# Patient Record
Sex: Female | Born: 1970 | Race: White | Hispanic: No | Marital: Married | Smoking: Former smoker
Health system: Southern US, Community
[De-identification: ages and names within clinical notes are randomized; demographics above are authoritative.]

## PROBLEM LIST (undated history)

## (undated) ENCOUNTER — Ambulatory Visit: Admission: EM | Payer: PRIVATE HEALTH INSURANCE

## (undated) DIAGNOSIS — I499 Cardiac arrhythmia, unspecified: Secondary | ICD-10-CM

## (undated) DIAGNOSIS — E785 Hyperlipidemia, unspecified: Secondary | ICD-10-CM

## (undated) DIAGNOSIS — E119 Type 2 diabetes mellitus without complications: Secondary | ICD-10-CM

## (undated) DIAGNOSIS — A048 Other specified bacterial intestinal infections: Secondary | ICD-10-CM

## (undated) DIAGNOSIS — I10 Essential (primary) hypertension: Secondary | ICD-10-CM

## (undated) HISTORY — PX: LAPAROSCOPIC ENDOMETRIOSIS FULGURATION: SUR769

## (undated) HISTORY — DX: Cardiac arrhythmia, unspecified: I49.9

---

## 2016-06-06 ENCOUNTER — Encounter (HOSPITAL_COMMUNITY): Payer: Self-pay | Admitting: *Deleted

## 2016-06-06 ENCOUNTER — Ambulatory Visit (HOSPITAL_COMMUNITY)
Admission: EM | Admit: 2016-06-06 | Discharge: 2016-06-06 | Disposition: A | Discharge status: Home / Self Care | Payer: PRIVATE HEALTH INSURANCE | Attending: Family Medicine | Admitting: Family Medicine

## 2016-06-06 DIAGNOSIS — R6889 Other general symptoms and signs: Secondary | ICD-10-CM | POA: Diagnosis not present

## 2016-06-06 DIAGNOSIS — R112 Nausea with vomiting, unspecified: Secondary | ICD-10-CM | POA: Diagnosis not present

## 2016-06-06 DIAGNOSIS — R197 Diarrhea, unspecified: Secondary | ICD-10-CM

## 2016-06-06 MED ORDER — ONDANSETRON 4 MG PO TBDP: 4.0000 mg | ORAL_TABLET | Freq: Three times a day (TID) | ORAL | 0 refills | Status: DC | PRN

## 2016-06-06 MED ORDER — LOPERAMIDE HCL 2 MG PO CAPS: 2.0000 mg | ORAL_CAPSULE | Freq: Four times a day (QID) | ORAL | 0 refills | Status: DC | PRN

## 2016-06-06 NOTE — ED Provider Notes (Signed)
CSN: [id number]     Arrival date & time 06/06/16  1711 History   None    Chief Complaint  Patient presents with  . Nausea  . Emesis  . Diarrhea  . Abdominal Pain   (Consider location/radiation/quality/duration/timing/severity/associated sxs/prior Treatment) 46 year old female presents with 24 hour history of nausea, vomiting, and diarrhea along with muscle aches and body aches. She denies fever but has had chills, no cough, no shortness of breath but has had some congestion. She has been drinking fluids but has not eaten, her appetite has been decreased.   The history is provided by the patient.  Emesis  Associated symptoms: abdominal pain and diarrhea   Diarrhea  Associated symptoms: abdominal pain and vomiting   Abdominal Pain  Associated symptoms: diarrhea and vomiting     History reviewed. No pertinent past medical history. History reviewed. No pertinent surgical history. History reviewed. No pertinent family history. Social History  Substance Use Topics  . Smoking status: Never Smoker  . Smokeless tobacco: Never Used  . Alcohol use Not on file   OB History    No data available     Review of Systems  Reason unable to perform ROS: as covered in HPI.  Gastrointestinal: Positive for abdominal pain, diarrhea and vomiting.  All other systems reviewed and are negative.   Allergies  Penicillins  Home Medications   Prior to Admission medications   Medication Sig Start Date End Date Taking? Authorizing Provider  loperamide (IMODIUM) 2 MG capsule Take 1 capsule (2 mg total) by mouth 4 (four) times daily as needed for diarrhea or loose stools. 06/06/16   Dorena Bodo, NP  ondansetron (ZOFRAN ODT) 4 MG disintegrating tablet Take 1 tablet (4 mg total) by mouth every 8 (eight) hours as needed for nausea or vomiting. 06/06/16   Dorena Bodo, NP   Meds Ordered and Administered this Visit  Medications - No data to display  BP 152/77 (BP Location: Left Arm)   Pulse 90    Temp 97.6 F (36.4 C) (Oral)   Resp 16   SpO2 100%  No data found.   Physical Exam  Constitutional: She is oriented to person, place, and time. She appears well-developed and well-nourished. She appears ill. No distress.  HENT:  Head: Normocephalic and atraumatic.  Right Ear: Tympanic membrane and external ear normal.  Left Ear: Tympanic membrane and external ear normal.  Mouth/Throat: Oropharynx is clear and moist.  Eyes: Pupils are equal, round, and reactive to light.  Neck: Neck supple. No JVD present.  Cardiovascular: Normal rate and regular rhythm.   Pulmonary/Chest: Effort normal and breath sounds normal.  Abdominal: Soft. Bowel sounds are normal. She exhibits no distension. There is no tenderness. There is no guarding.  Lymphadenopathy:    She has no cervical adenopathy.  Neurological: She is alert and oriented to person, place, and time.  Skin: Skin is warm and dry. Capillary refill takes less than 2 seconds. She is not diaphoretic.  Psychiatric: She has a normal mood and affect.  Nursing note and vitals reviewed.   Urgent Care Course     Procedures (including critical care time)  Labs Review Labs Reviewed - No data to display  Imaging Review No results found.   Visual Acuity Review  Right Eye Distance:   Left Eye Distance:   Bilateral Distance:    Right Eye Near:   Left Eye Near:    Bilateral Near:         MDM  1. Flu-like symptoms   2. Nausea vomiting and diarrhea   You most likely have a viral illness. I have sent prescriptions to your pharmacy for loperamide, take 1 tablet 4 times a day for diarrhea and and Zofran, one tablet under the tongue every 8 hours as needed for nausea. Take tylenol every 4 hours for fever, body aches, muscle aches, or head aches. Should symptoms fail to improve or worsen follow up with your primary care provider or return to clinic.     Dorena BodoLawrence Jaylia Pettus, NP 06/06/16 1926

## 2016-06-06 NOTE — ED Triage Notes (Signed)
Patient report n/v/d that started last night with lower abdominal pain.

## 2016-06-06 NOTE — Discharge Instructions (Signed)
You most likely have a viral illness. I have sent prescriptions to your pharmacy for loperamide, take 1 tablet 4 times a day for diarrhea and and Zofran, one tablet under the tongue every 8 hours as needed for nausea. Take tylenol every 4 hours for fever, body aches, muscle aches, or head aches. Should symptoms fail to improve or worsen follow up with your primary care provider or return to clinic.

## 2019-08-15 DIAGNOSIS — E119 Type 2 diabetes mellitus without complications: Secondary | ICD-10-CM

## 2019-08-15 DIAGNOSIS — Z794 Long term (current) use of insulin: Secondary | ICD-10-CM

## 2019-08-15 DIAGNOSIS — F419 Anxiety disorder, unspecified: Secondary | ICD-10-CM | POA: Diagnosis present

## 2019-12-26 DIAGNOSIS — I471 Supraventricular tachycardia: Secondary | ICD-10-CM | POA: Diagnosis present

## 2020-03-29 ENCOUNTER — Encounter: Payer: Self-pay | Admitting: Emergency Medicine

## 2020-03-29 ENCOUNTER — Emergency Department: Payer: PRIVATE HEALTH INSURANCE

## 2020-03-29 ENCOUNTER — Inpatient Hospital Stay
Admission: EM | Admit: 2020-03-29 | Discharge: 2020-03-31 | DRG: 872 | Disposition: A | Discharge status: Home / Self Care | Payer: PRIVATE HEALTH INSURANCE | Attending: Internal Medicine | Admitting: Internal Medicine

## 2020-03-29 ENCOUNTER — Other Ambulatory Visit: Payer: Self-pay

## 2020-03-29 DIAGNOSIS — F32A Depression, unspecified: Secondary | ICD-10-CM | POA: Diagnosis present

## 2020-03-29 DIAGNOSIS — Z83438 Family history of other disorder of lipoprotein metabolism and other lipidemia: Secondary | ICD-10-CM

## 2020-03-29 DIAGNOSIS — Z794 Long term (current) use of insulin: Secondary | ICD-10-CM

## 2020-03-29 DIAGNOSIS — Z20822 Contact with and (suspected) exposure to covid-19: Secondary | ICD-10-CM | POA: Diagnosis present

## 2020-03-29 DIAGNOSIS — E119 Type 2 diabetes mellitus without complications: Secondary | ICD-10-CM | POA: Diagnosis present

## 2020-03-29 DIAGNOSIS — E785 Hyperlipidemia, unspecified: Secondary | ICD-10-CM | POA: Diagnosis present

## 2020-03-29 DIAGNOSIS — A419 Sepsis, unspecified organism: Secondary | ICD-10-CM | POA: Diagnosis present

## 2020-03-29 DIAGNOSIS — Z8261 Family history of arthritis: Secondary | ICD-10-CM

## 2020-03-29 DIAGNOSIS — I10 Essential (primary) hypertension: Secondary | ICD-10-CM | POA: Diagnosis present

## 2020-03-29 DIAGNOSIS — I959 Hypotension, unspecified: Secondary | ICD-10-CM | POA: Diagnosis present

## 2020-03-29 DIAGNOSIS — L03213 Periorbital cellulitis: Secondary | ICD-10-CM | POA: Diagnosis present

## 2020-03-29 DIAGNOSIS — B029 Zoster without complications: Secondary | ICD-10-CM | POA: Diagnosis present

## 2020-03-29 DIAGNOSIS — Z880 Allergy status to penicillin: Secondary | ICD-10-CM

## 2020-03-29 DIAGNOSIS — F419 Anxiety disorder, unspecified: Secondary | ICD-10-CM | POA: Diagnosis present

## 2020-03-29 DIAGNOSIS — I471 Supraventricular tachycardia: Secondary | ICD-10-CM | POA: Diagnosis present

## 2020-03-29 DIAGNOSIS — L03211 Cellulitis of face: Secondary | ICD-10-CM | POA: Diagnosis present

## 2020-03-29 HISTORY — DX: Hyperlipidemia, unspecified: E78.5

## 2020-03-29 HISTORY — DX: Type 2 diabetes mellitus without complications: E11.9

## 2020-03-29 HISTORY — DX: Essential (primary) hypertension: I10

## 2020-03-29 LAB — CBC WITH DIFFERENTIAL/PLATELET
Abs Immature Granulocytes: 0.05 10*3/uL (ref 0.00–0.07)
Basophils Absolute: 0 10*3/uL (ref 0.0–0.1)
Basophils Relative: 0 %
Eosinophils Absolute: 0 10*3/uL (ref 0.0–0.5)
Eosinophils Relative: 0 %
HCT: 42.7 % (ref 36.0–46.0)
Hemoglobin: 14.8 g/dL (ref 12.0–15.0)
Immature Granulocytes: 0 %
Lymphocytes Relative: 5 %
Lymphs Abs: 0.5 10*3/uL — ABNORMAL LOW (ref 0.7–4.0)
MCH: 29.8 pg (ref 26.0–34.0)
MCHC: 34.7 g/dL (ref 30.0–36.0)
MCV: 85.9 fL (ref 80.0–100.0)
Monocytes Absolute: 0.5 10*3/uL (ref 0.1–1.0)
Monocytes Relative: 4 %
Neutro Abs: 10.2 10*3/uL — ABNORMAL HIGH (ref 1.7–7.7)
Neutrophils Relative %: 91 %
Platelets: 222 10*3/uL (ref 150–400)
RBC: 4.97 MIL/uL (ref 3.87–5.11)
RDW: 11.3 % — ABNORMAL LOW (ref 11.5–15.5)
WBC: 11.3 10*3/uL — ABNORMAL HIGH (ref 4.0–10.5)
nRBC: 0 % (ref 0.0–0.2)

## 2020-03-29 LAB — COMPREHENSIVE METABOLIC PANEL
ALT: 17 U/L (ref 0–44)
AST: 20 U/L (ref 15–41)
Albumin: 3.9 g/dL (ref 3.5–5.0)
Alkaline Phosphatase: 126 U/L (ref 38–126)
Anion gap: 12 (ref 5–15)
BUN: 10 mg/dL (ref 6–20)
CO2: 21 mmol/L — ABNORMAL LOW (ref 22–32)
Calcium: 8.6 mg/dL — ABNORMAL LOW (ref 8.9–10.3)
Chloride: 100 mmol/L (ref 98–111)
Creatinine, Ser: 0.45 mg/dL (ref 0.44–1.00)
GFR, Estimated: >60 mL/min (ref >60)
Glucose, Bld: 221 mg/dL — ABNORMAL HIGH (ref 70–99)
Potassium: 3.7 mmol/L (ref 3.5–5.1)
Sodium: 133 mmol/L — ABNORMAL LOW (ref 135–145)
Total Bilirubin: 1.5 mg/dL — ABNORMAL HIGH (ref 0.3–1.2)
Total Protein: 7.3 g/dL (ref 6.5–8.1)

## 2020-03-29 LAB — RESPIRATORY PANEL BY RT PCR (FLU A&B, COVID)
Influenza A by PCR: NEGATIVE
Influenza B by PCR: NEGATIVE
SARS Coronavirus 2 by RT PCR: NEGATIVE

## 2020-03-29 LAB — LACTIC ACID, PLASMA: Lactic Acid, Venous: 1.2 mmol/L (ref 0.5–1.9)

## 2020-03-29 MED ORDER — LACTATED RINGERS IV BOLUS (SEPSIS)
1000.0000 mL | Freq: Once | INTRAVENOUS | Status: AC
  Administered 2020-03-29: 1000 mL via INTRAVENOUS

## 2020-03-29 MED ORDER — LACTATED RINGERS IV SOLN: INTRAVENOUS | Status: AC

## 2020-03-29 MED ORDER — VANCOMYCIN HCL IN DEXTROSE 1-5 GM/200ML-% IV SOLN
1000.0000 mg | Freq: Once | INTRAVENOUS | Status: AC
  Administered 2020-03-29: 1000 mg via INTRAVENOUS
  Filled 2020-03-29: qty 200

## 2020-03-29 MED ORDER — IOHEXOL 300 MG/ML  SOLN
75.0000 mL | Freq: Once | INTRAMUSCULAR | Status: AC | PRN
  Administered 2020-03-29: 75 mL via INTRAVENOUS

## 2020-03-29 MED ORDER — INSULIN ASPART 100 UNIT/ML ~~LOC~~ SOLN
0.0000 [IU] | Freq: Every day | SUBCUTANEOUS | Status: DC
  Administered 2020-03-30: 2 [IU] via SUBCUTANEOUS
  Filled 2020-03-29 (×2): qty 1

## 2020-03-29 MED ORDER — ONDANSETRON HCL 4 MG PO TABS: 4.0000 mg | ORAL_TABLET | Freq: Four times a day (QID) | ORAL | Status: DC | PRN

## 2020-03-29 MED ORDER — ENOXAPARIN SODIUM 40 MG/0.4ML ~~LOC~~ SOLN
40.0000 mg | SUBCUTANEOUS | Status: DC
  Administered 2020-03-30 – 2020-03-31 (×2): 40 mg via SUBCUTANEOUS
  Filled 2020-03-29 (×2): qty 0.4

## 2020-03-29 MED ORDER — ACETAMINOPHEN 325 MG PO TABS
650.0000 mg | ORAL_TABLET | Freq: Four times a day (QID) | ORAL | Status: DC | PRN
  Administered 2020-03-30 – 2020-03-31 (×2): 650 mg via ORAL
  Filled 2020-03-29 (×2): qty 2

## 2020-03-29 MED ORDER — VANCOMYCIN HCL 500 MG/100ML IV SOLN
500.0000 mg | Freq: Once | INTRAVENOUS | Status: AC
  Administered 2020-03-30: 500 mg via INTRAVENOUS
  Filled 2020-03-29 (×2): qty 100

## 2020-03-29 MED ORDER — INSULIN ASPART 100 UNIT/ML ~~LOC~~ SOLN
0.0000 [IU] | Freq: Three times a day (TID) | SUBCUTANEOUS | Status: DC
  Administered 2020-03-30: 5 [IU] via SUBCUTANEOUS
  Administered 2020-03-30 – 2020-03-31 (×3): 3 [IU] via SUBCUTANEOUS
  Filled 2020-03-29 (×5): qty 1

## 2020-03-29 MED ORDER — KETOROLAC TROMETHAMINE 30 MG/ML IJ SOLN: 30.0000 mg | Freq: Four times a day (QID) | INTRAMUSCULAR | Status: DC | PRN

## 2020-03-29 MED ORDER — VANCOMYCIN HCL IN DEXTROSE 1-5 GM/200ML-% IV SOLN: 1000.0000 mg | Freq: Once | INTRAVENOUS | Status: DC

## 2020-03-29 MED ORDER — HYDROCODONE-ACETAMINOPHEN 5-325 MG PO TABS: 1.0000 | ORAL_TABLET | ORAL | Status: DC | PRN

## 2020-03-29 MED ORDER — ACETAMINOPHEN 650 MG RE SUPP: 650.0000 mg | Freq: Four times a day (QID) | RECTAL | Status: DC | PRN

## 2020-03-29 MED ORDER — SODIUM CHLORIDE 0.9 % IV SOLN
2.0000 g | Freq: Once | INTRAVENOUS | Status: AC
  Administered 2020-03-29: 2 g via INTRAVENOUS
  Filled 2020-03-29: qty 20

## 2020-03-29 MED ORDER — LACTATED RINGERS IV SOLN: INTRAVENOUS | Status: DC

## 2020-03-29 MED ORDER — KETOROLAC TROMETHAMINE 30 MG/ML IJ SOLN
15.0000 mg | Freq: Once | INTRAMUSCULAR | Status: AC
  Administered 2020-03-29: 15 mg via INTRAVENOUS
  Filled 2020-03-29: qty 1

## 2020-03-29 MED ORDER — ONDANSETRON HCL 4 MG/2ML IJ SOLN
4.0000 mg | Freq: Four times a day (QID) | INTRAMUSCULAR | Status: DC | PRN
  Administered 2020-03-30 (×2): 4 mg via INTRAVENOUS
  Filled 2020-03-29 (×2): qty 2

## 2020-03-29 MED ORDER — ACETAMINOPHEN 500 MG PO TABS
1000.0000 mg | ORAL_TABLET | Freq: Once | ORAL | Status: AC
  Administered 2020-03-29: 1000 mg via ORAL
  Filled 2020-03-29: qty 2

## 2020-03-29 NOTE — ED Triage Notes (Signed)
Pt to ED from home c/o right eye swelling mainly today, states unable to open eye or see through it.  Pt reports being seen at urgent care yesterday and dx and treated for shingles.  States some drainage from eye.  Nausea without vomiting.

## 2020-03-29 NOTE — ED Provider Notes (Signed)
[facility] Emergency Department Provider Note  ____________________________________________   First MD Initiated Contact with Patient 03/29/20 2008     (approximate)  I have reviewed the triage vital signs and the nursing notes.   HISTORY  Chief Complaint Facial Swelling and Herpes Zoster    HPI Debra Andrews is a 49 y.o. female with history of hypertension, hyperlipidemia, diabetes, here with right facial swelling for the patient states her symptoms started approximately 3 days ago as initial redness, swelling along her forehead with some mild tenderness.  Over the next 48 hours, her pain has worsened and she has now developed significant entire right face swelling with swelling of her eyelids.  She now cannot open her eye due to swelling.  When she does open her eye, she denies any overt changes in vision.  She has had an associated headache, chills, fevers.  She was seen at urgent care and given acyclovir, but has had worsening of symptoms since then.  Denies any preceding injury.  No history of prior shingles.  No recent illness.  Nursing immunizations.  No pain with extraocular movements.  No consensual photophobia.  No other complaints.        Past Medical History:  Diagnosis Date   Diabetes mellitus without complication (HCC)    Hyperlipidemia    Hypertension     There are no problems to display for this patient.   History reviewed. No pertinent surgical history.  Prior to Admission medications   Medication Sig Start Date End Date Taking? Authorizing Provider  loperamide (IMODIUM) 2 MG capsule Take 1 capsule (2 mg total) by mouth 4 (four) times daily as needed for diarrhea or loose stools. 06/06/16   Dorena Bodo, NP  ondansetron (ZOFRAN ODT) 4 MG disintegrating tablet Take 1 tablet (4 mg total) by mouth every 8 (eight) hours as needed for nausea or vomiting. 06/06/16   Dorena Bodo, NP    Allergies Penicillins  History  reviewed. No pertinent family history.  Social History Social History   Tobacco Use   Smoking status: Never Smoker   Smokeless tobacco: Never Used  Substance Use Topics   Alcohol use: Not on file    Comment: occ.   Drug use: Never    Review of Systems  Review of Systems  Constitutional: Positive for chills, fatigue and fever.  HENT: Positive for facial swelling. Negative for sore throat.   Eyes: Positive for redness.  Respiratory: Negative for shortness of breath.   Cardiovascular: Negative for chest pain.  Gastrointestinal: Negative for abdominal pain.  Genitourinary: Negative for flank pain.  Musculoskeletal: Negative for neck pain.  Skin: Negative for rash and wound.  Allergic/Immunologic: Negative for immunocompromised state.  Neurological: Negative for weakness and numbness.  Hematological: Does not bruise/bleed easily.     ____________________________________________  PHYSICAL EXAM:      VITAL SIGNS: ED Triage Vitals  Enc Vitals Group     BP 03/29/20 1920 108/76     Pulse Rate 03/29/20 1920 (!) 110     Resp 03/29/20 1920 18     Temp 03/29/20 1920 (!) 100.7 F (38.2 C)     Temp Source 03/29/20 1920 Oral     SpO2 03/29/20 1920 98 %     Weight 03/29/20 1925 138 lb (62.6 kg)     Height 03/29/20 1925 4\' 11"  (1.499 m)     Head Circumference --      Peak Flow --      Pain Score 03/29/20 1925  7     Pain Loc --      Pain Edu? --      Excl. in GC? --      Physical Exam Vitals and nursing note reviewed.  Constitutional:      General: She is not in acute distress.    Appearance: She is well-developed.  HENT:     Head: Normocephalic and atraumatic.     Comments: Marked right facial swelling, worse along the orbit, with mild redness, induration, and tenderness to palpation.  No overt fluctuance.  No apparent dental abscess or pain. Eyes:     Conjunctiva/sclera: Conjunctivae normal.     Comments: Marked right periorbital edema with small amount of crusting  and serous drainage.  The orbit itself appears intact, with no proptosis or pain with extraocular movements.  No direct or consensual photophobia.  Cardiovascular:     Rate and Rhythm: Normal rate and regular rhythm.     Heart sounds: Normal heart sounds. No murmur heard.  No friction rub.  Pulmonary:     Effort: Pulmonary effort is normal. No respiratory distress.     Breath sounds: Normal breath sounds. No wheezing or rales.  Abdominal:     General: There is no distension.     Palpations: Abdomen is soft.     Tenderness: There is no abdominal tenderness.  Musculoskeletal:     Cervical back: Neck supple.  Skin:    General: Skin is warm.     Capillary Refill: Capillary refill takes less than 2 seconds.  Neurological:     Mental Status: She is alert and oriented to person, place, and time.     Motor: No abnormal muscle tone.       ____________________________________________   LABS (all labs ordered are listed, but only abnormal results are displayed)  Labs Reviewed  CBC WITH DIFFERENTIAL/PLATELET - Abnormal; Notable for the following components:      Result Value   WBC 11.3 (*)    RDW 11.3 (*)    Neutro Abs 10.2 (*)    Lymphs Abs 0.5 (*)    All other components within normal limits  COMPREHENSIVE METABOLIC PANEL - Abnormal; Notable for the following components:   Sodium 133 (*)    CO2 21 (*)    Glucose, Bld 221 (*)    Calcium 8.6 (*)    Total Bilirubin 1.5 (*)    All other components within normal limits  RESPIRATORY PANEL BY RT PCR (FLU A&B, COVID)  CULTURE, BLOOD (ROUTINE X 2)  CULTURE, BLOOD (ROUTINE X 2)  LACTIC ACID, PLASMA  LACTIC ACID, PLASMA  POC URINE PREG, ED    ____________________________________________  EKG: Normal sinus rhythm, ventricular 99.  QRS 80, QTc 446.  Low voltage.  No acute ST elevations or depressions.  No EKG evidence of acute ischemia or infarct. ________________________________________  RADIOLOGY All imaging, including plain  films, CT scans, and ultrasounds, independently reviewed by me, and interpretations confirmed via formal radiology reads.  ED MD interpretation:   CT Face: reviewed, shows preseptal cellulitis without orbital extension, no abscess  Official radiology report(s): No results found.  ____________________________________________  PROCEDURES   Procedure(s) performed (including Critical Care):  .Critical Care Performed by: Shaune Pollack, MD Authorized by: Shaune Pollack, MD   Critical care provider statement:    Critical care time (minutes):  35   Critical care time was exclusive of:  Separately billable procedures and treating other patients and teaching time   Critical care was necessary to  treat or prevent imminent or life-threatening deterioration of the following conditions:  Cardiac failure, circulatory failure and respiratory failure   Critical care was time spent personally by me on the following activities:  Development of treatment plan with patient or surrogate, discussions with consultants, evaluation of patient's response to treatment, examination of patient, obtaining history from patient or surrogate, ordering and performing treatments and interventions, ordering and review of laboratory studies, ordering and review of radiographic studies, pulse oximetry, re-evaluation of patient's condition and review of old charts   I assumed direction of critical care for this patient from another provider in my specialty: no      ____________________________________________  INITIAL IMPRESSION / MDM / ASSESSMENT AND PLAN / ED COURSE  As part of my medical decision making, I reviewed the following data within the electronic MEDICAL RECORD NUMBER Nursing notes reviewed and incorporated, Old chart reviewed, Notes from prior ED visits, and [place] Controlled Substance Database       *Debra Andrews was evaluated in Emergency Department on 03/29/2020 for the symptoms described in the history of  present illness. She was evaluated in the context of the global COVID-19 pandemic, which necessitated consideration that the patient might be at risk for infection with the SARS-CoV-2 virus that causes COVID-19. Institutional protocols and algorithms that pertain to the evaluation of patients at risk for COVID-19 are in a state of rapid change based on information released by regulatory bodies including the CDC and federal and state organizations. These policies and algorithms were followed during the patient's care in the ED.  Some ED evaluations and interventions may be delayed as a result of limited staffing during the pandemic.*     Medical Decision Making:  49 yo F here with R facial swelling and pain. On arrival, pt febrile, tachycardic, hypotensive. Sepsis protocol initiated with broad-spectrum ABX - Vanc/Rocephin given. 30 cc/kg fluids ordered. Labs pending. Clinically, I see no evidence of orbital cellulitis, with no proptosis, vision changes, pain with EOM, photophobia. Will check CT for further assessment.  On my review, CT scan is c/w periorbital cellulitis, no apparent postseptal/orbital extension. No apparent abscess. Will admit for IVF, ABX. Clinically, pt has no skin lesions to suggest Zoster. No signs of ocular zoster or other complications.  ____________________________________________  FINAL CLINICAL IMPRESSION(S) / ED DIAGNOSES  Final diagnoses:  Periorbital cellulitis of right eye     MEDICATIONS GIVEN DURING THIS VISIT:  Medications  lactated ringers infusion (has no administration in time range)  vancomycin (VANCOCIN) IVPB 1000 mg/200 mL premix (1,000 mg Intravenous New Bag/Given 03/29/20 2156)  lactated ringers bolus 1,000 mL (1,000 mLs Intravenous New Bag/Given 03/29/20 2120)    And  lactated ringers bolus 1,000 mL (1,000 mLs Intravenous New Bag/Given 03/29/20 2120)  cefTRIAXone (ROCEPHIN) 2 g in sodium chloride 0.9 % 100 mL IVPB (0 g Intravenous Stopped 03/29/20  2153)  ketorolac (TORADOL) 30 MG/ML injection 15 mg (15 mg Intravenous Given 03/29/20 2118)  acetaminophen (TYLENOL) tablet 1,000 mg (1,000 mg Oral Given 03/29/20 2217)  iohexol (OMNIPAQUE) 300 MG/ML solution 75 mL (75 mLs Intravenous Contrast Given 03/29/20 2222)     ED Discharge Orders    None       Note:  This document was prepared using Dragon voice recognition software and may include unintentional dictation errors.   Shaune Pollack, MD 03/30/20 [phone]

## 2020-03-29 NOTE — Consult Note (Signed)
CODE SEPSIS - PHARMACY COMMUNICATION  **Broad Spectrum Antibiotics should be administered within 1 hour of Sepsis diagnosis**  Time Code Sepsis Called/Page Received: 2026  Antibiotics Ordered: ceftriaxone and vancomycin  Time of 1st antibiotic administration: 2126  Additional action taken by pharmacy: messaged RN  If necessary, Name of Provider/Nurse Contacted: Marcia Brash ,PharmD Clinical Pharmacist  03/29/2020  9:32 PM

## 2020-03-29 NOTE — ED Notes (Signed)
Sent MD Issacs message pt temp 103

## 2020-03-29 NOTE — H&P (Addendum)
History and Physical    Kolbi Tofte GGE:[id number] DOB: [birth year] DOA: 03/29/2020  PCP: Marylen Ponto, MD   Patient coming from: Home  I have personally briefly reviewed patient's old medical records in [facility]  Chief Complaint: Swelling right eyelid and right side of face  HPI: Debra Andrews is a 49 y.o. female with medical history significant for DM, HTN, paroxysmal supraventricular tachycardia on antiarrhythmics and anxiety who presents to the emergency room with a 3-day history of progressively worsening swelling of the right eye.  She states it started with some redness above the right eye and then started spreading downwards to the nasal bridge then to the right eyelids then right cheek and now the left cheek.  The swelling of the right eyelids increased  to where her eyes were closed shut  She has had no discharge from the eye but says she sees a bit blurred out of the eye.  Had no recent rashes, pimples, insect bites or stings that she is aware of.  Denies recent cold, sinusitis or dental pain.  It She went to the urgent care and she was prescribed acyclovir for suspicion of herpes zoster but symptoms continue to worsen for which reason she presented to the emergency room. ED Course: On arrival, she was febrile at 103, tachycardic at 102 with otherwise normal vitals.  [id number] with normal lactic acid of 1.2.  Other labs, reviewed and mostly unremarkable.  Covid and flu test negative.  Maxillofacial CT with contrast showed periorbital soft tissue swelling of the right orbit with no post septal extension. Patient started on ceftriaxone and vancomycin, sepsis fluid bolus.  Hospitalist consulted for admission.  Review of Systems: As per HPI otherwise all other systems on review of systems negative.    Past Medical History:  Diagnosis Date  . Diabetes mellitus without complication (HCC)   . Hyperlipidemia   . Hypertension     History reviewed. No pertinent surgical  history.   reports that she has never smoked. She has never used smokeless tobacco. She reports that she does not use drugs. No history on file for alcohol use.  Allergies  Allergen Reactions  . Penicillins     History reviewed. No pertinent family history.    Prior to Admission medications   Medication Sig Start Date End Date Taking? Authorizing Provider  loperamide (IMODIUM) 2 MG capsule Take 1 capsule (2 mg total) by mouth 4 (four) times daily as needed for diarrhea or loose stools. 06/06/16   Dorena Bodo, NP  ondansetron (ZOFRAN ODT) 4 MG disintegrating tablet Take 1 tablet (4 mg total) by mouth every 8 (eight) hours as needed for nausea or vomiting. 06/06/16   Dorena Bodo, NP    Physical Exam: Vitals:   03/29/20 1925 03/29/20 2149 03/29/20 2200 03/29/20 2215  BP:   115/85 105/69  Pulse:   (!) 109 (!) 103  Resp:   16 (!) 31  Temp:  (!) 103 F (39.4 C)    TempSrc:  Oral    SpO2:   96% 96%  Weight: 62.6 kg     Height: 4\' 11"  (1.499 m)        Vitals:   03/29/20 1925 03/29/20 2149 03/29/20 2200 03/29/20 2215  BP:   115/85 105/69  Pulse:   (!) 109 (!) 103  Resp:   16 (!) 31  Temp:  (!) 103 F (39.4 C)    TempSrc:  Oral    SpO2:   96% 96%  Weight:  62.6 kg     Height: 4\' 11"  (1.499 m)         Constitutional: Alert and oriented x 3 . Not in any apparent distress HEENT:      Head: Normocephalic and atraumatic.         Eyes: PERLA, EOMI without pain, mild redness right conjunctival. Sclera is non-icteric.  Palpebral swelling right eye.  No discharge      Mouth/Throat: Mucous membranes are moist.       Neck: Supple with no signs of meningismus. Cardiovascular:  Tachycardia*. No murmurs, gallops, or rubs. 2+ symmetrical distal pulses are present . No JVD. No LE edema Respiratory: Respiratory effort normal .Lungs sounds clear bilaterally. No wheezes, crackles, or rhonchi.  Gastrointestinal: Soft, non tender, and non distended with positive bowel sounds. No  rebound or guarding. Genitourinary: No CVA tenderness. Musculoskeletal: Nontender with normal range of motion in all extremities. No cyanosis, or erythema of extremities. Neurologic:  Face is symmetric. Moving all extremities. No gross focal neurologic deficits . Skin:  Redness to forehead, nasal bridge, right eyelids and bilateral cheeks over zygomatic arches, with warmth Psychiatric: Mood and affect are normal    Labs on Admission: I have personally reviewed following labs and imaging studies  CBC: Recent Labs  Lab 03/29/20 2047  WBC 11.3*  NEUTROABS 10.2*  HGB 14.8  HCT 42.7  MCV 85.9  PLT 222   Basic Metabolic Panel: Recent Labs  Lab 03/29/20 2047  NA 133*  K 3.7  CL 100  CO2 21*  GLUCOSE 221*  BUN 10  CREATININE 0.45  CALCIUM 8.6*   GFR: Estimated Creatinine Clearance: 67.7 mL/min (by C-G formula based on SCr of 0.45 mg/dL). Liver Function Tests: Recent Labs  Lab 03/29/20 2047  AST 20  ALT 17  ALKPHOS 126  BILITOT 1.5*  PROT 7.3  ALBUMIN 3.9   No results for input(s): LIPASE, AMYLASE in the last 168 hours. No results for input(s): AMMONIA in the last 168 hours. Coagulation Profile: No results for input(s): INR, PROTIME in the last 168 hours. Cardiac Enzymes: No results for input(s): CKTOTAL, CKMB, CKMBINDEX, TROPONINI in the last 168 hours. BNP (last 3 results) No results for input(s): PROBNP in the last 8760 hours. HbA1C: No results for input(s): HGBA1C in the last 72 hours. CBG: No results for input(s): GLUCAP in the last 168 hours. Lipid Profile: No results for input(s): CHOL, HDL, LDLCALC, TRIG, CHOLHDL, LDLDIRECT in the last 72 hours. Thyroid Function Tests: No results for input(s): TSH, T4TOTAL, FREET4, T3FREE, THYROIDAB in the last 72 hours. Anemia Panel: No results for input(s): VITAMINB12, FOLATE, FERRITIN, TIBC, IRON, RETICCTPCT in the last 72 hours. Urine analysis: No results found for: COLORURINE, APPEARANCEUR, LABSPEC, PHURINE,  GLUCOSEU, HGBUR, BILIRUBINUR, KETONESUR, PROTEINUR, UROBILINOGEN, NITRITE, LEUKOCYTESUR  Radiological Exams on Admission: CT Maxillofacial W Contrast  Result Date: 03/29/2020 CLINICAL DATA:  Right eye swelling EXAM: CT MAXILLOFACIAL WITH CONTRAST TECHNIQUE: Multidetector CT imaging of the maxillofacial structures was performed with intravenous contrast. Multiplanar CT image reconstructions were also generated. CONTRAST:  14mL OMNIPAQUE IOHEXOL 300 MG/ML  SOLN COMPARISON:  None. FINDINGS: Osseous: No fracture or mandibular dislocation. No destructive process. Orbits: Periorbital soft tissue swelling at the right orbit. No postseptal extension. Sinuses: Normal Soft tissues: Right periorbital soft tissue swelling but otherwise normal. Limited intracranial: Normal IMPRESSION: Right periorbital cellulitis without postseptal extension. Electronically Signed   By: 71m M.D.   On: 03/29/2020 22:56     Assessment/Plan 49 year old female with history of  DM, HTN, anxiety, paroxysmal supraventricular tachycardia on antiarrhythmics  who presenting with a 3-day history of progressively worsening pain, swelling and redness of the right eyelid.     Preseptal cellulitis of right eye /facial cellulitis   Sepsis (HCC) -Sepsis criteria include fever of 103, tachycardia of 112, WBC 11,000 -Maxillofacial CT showing right periorbital cellulitis without post septal extension -Continue IV hydration -IV vancomycin and cefepime    Type 2 diabetes mellitus without complication, with long-term current use of insulin (HCC) -Sliding scale insulin coverage.  Continue Lantus pending med rec -Hold home glyburide and Metformin for now    Paroxysmal supraventricular tachycardia (HCC) -Continue home flecainide and metoprolol    Anxiety disorder -Continue duloxetine    Essential hypertension -Continue lisinopril    DVT prophylaxis: Lovenox  Code Status: full code  Family Communication: Husband at  bedside Disposition Plan: Back to previous home environment Consults called: none  Status:At the time of admission, it appears that the appropriate admission status for this patient is INPATIENT. This is judged to be reasonable and necessary in order to provide the required intensity of service to ensure the patient's safety given the presenting symptoms, physical exam findings, and initial radiographic and laboratory data in the context of their  Comorbid conditions.   Patient requires inpatient status due to high intensity of service, high risk for further deterioration and high frequency of surveillance required.   I certify that at the point of admission it is my clinical judgment that the patient will require inpatient hospital care spanning beyond 2 midnights     Andris Baumann MD [facility]     03/29/2020, 11:14 PM

## 2020-03-29 NOTE — ED Notes (Signed)
Patient transported to CT 

## 2020-03-29 NOTE — ED Notes (Signed)
Husband at bedside.  

## 2020-03-29 NOTE — Consult Note (Signed)
PHARMACY -  BRIEF ANTIBIOTIC NOTE   Pharmacy has received consult(s) for cellulitis from an ED provider.  The patient's profile has been reviewed for ht/wt/allergies/indication/available labs.    One time order(s) placed for vancomycin  Further antibiotics/pharmacy consults should be ordered by admitting physician if indicated.                       Thank you, Ronnald Ramp 03/29/2020  8:21 PM

## 2020-03-30 LAB — CBC
HCT: 40.4 % (ref 36.0–46.0)
HCT: 35.2 % — ABNORMAL LOW (ref 36.0–46.0)
Hemoglobin: 14.1 g/dL (ref 12.0–15.0)
Hemoglobin: 12.5 g/dL (ref 12.0–15.0)
MCH: 30 pg (ref 26.0–34.0)
MCH: 30.3 pg (ref 26.0–34.0)
MCHC: 34.9 g/dL (ref 30.0–36.0)
MCHC: 35.5 g/dL (ref 30.0–36.0)
MCV: 86 fL (ref 80.0–100.0)
MCV: 85.2 fL (ref 80.0–100.0)
Platelets: 201 10*3/uL (ref 150–400)
Platelets: 171 10*3/uL (ref 150–400)
RBC: 4.7 MIL/uL (ref 3.87–5.11)
RBC: 4.13 MIL/uL (ref 3.87–5.11)
RDW: 11.4 % — ABNORMAL LOW (ref 11.5–15.5)
RDW: 11.4 % — ABNORMAL LOW (ref 11.5–15.5)
WBC: 8.2 10*3/uL (ref 4.0–10.5)
WBC: 6.8 10*3/uL (ref 4.0–10.5)
nRBC: 0 % (ref 0.0–0.2)
nRBC: 0 % (ref 0.0–0.2)

## 2020-03-30 LAB — LACTIC ACID, PLASMA
Lactic Acid, Venous: 2.3 mmol/L (ref 0.5–1.9)
Lactic Acid, Venous: 1.1 mmol/L (ref 0.5–1.9)
Lactic Acid, Venous: 2.1 mmol/L (ref 0.5–1.9)

## 2020-03-30 LAB — BASIC METABOLIC PANEL
Anion gap: 7 (ref 5–15)
BUN: 9 mg/dL (ref 6–20)
CO2: 24 mmol/L (ref 22–32)
Calcium: 8.1 mg/dL — ABNORMAL LOW (ref 8.9–10.3)
Chloride: 105 mmol/L (ref 98–111)
Creatinine, Ser: 0.47 mg/dL (ref 0.44–1.00)
GFR, Estimated: >60 mL/min (ref >60)
Glucose, Bld: 237 mg/dL — ABNORMAL HIGH (ref 70–99)
Potassium: 4 mmol/L (ref 3.5–5.1)
Sodium: 136 mmol/L (ref 135–145)

## 2020-03-30 LAB — CULTURE, BLOOD (ROUTINE X 2): Special Requests: ADEQUATE

## 2020-03-30 LAB — CORTISOL-AM, BLOOD: Cortisol - AM: 23.9 ug/dL — ABNORMAL HIGH (ref 6.7–22.6)

## 2020-03-30 LAB — MRSA PCR SCREENING: MRSA by PCR: NEGATIVE

## 2020-03-30 LAB — GLUCOSE, CAPILLARY
Glucose-Capillary: 246 mg/dL — ABNORMAL HIGH (ref 70–99)
Glucose-Capillary: 211 mg/dL — ABNORMAL HIGH (ref 70–99)
Glucose-Capillary: 193 mg/dL — ABNORMAL HIGH (ref 70–99)
Glucose-Capillary: 194 mg/dL — ABNORMAL HIGH (ref 70–99)
Glucose-Capillary: 124 mg/dL — ABNORMAL HIGH (ref 70–99)

## 2020-03-30 LAB — PROTIME-INR
INR: 1.1 (ref 0.8–1.2)
Prothrombin Time: 13.7 seconds (ref 11.4–15.2)

## 2020-03-30 LAB — HEMOGLOBIN A1C
Hgb A1c MFr Bld: 12.6 % — ABNORMAL HIGH (ref 4.8–5.6)
Mean Plasma Glucose: 314.92 mg/dL

## 2020-03-30 LAB — CBG MONITORING, ED: Glucose-Capillary: 203 mg/dL — ABNORMAL HIGH (ref 70–99)

## 2020-03-30 LAB — PROCALCITONIN
Procalcitonin: 0.24 ng/mL
Procalcitonin: 0.45 ng/mL

## 2020-03-30 MED ORDER — DULOXETINE HCL 60 MG PO CPEP
60.0000 mg | ORAL_CAPSULE | Freq: Every day | ORAL | Status: DC
  Administered 2020-03-30 – 2020-03-31 (×2): 60 mg via ORAL
  Filled 2020-03-30 (×2): qty 1

## 2020-03-30 MED ORDER — SODIUM CHLORIDE 0.9 % IV SOLN
2.0000 g | Freq: Three times a day (TID) | INTRAVENOUS | Status: DC
  Administered 2020-03-30 – 2020-03-31 (×4): 2 g via INTRAVENOUS
  Filled 2020-03-30 (×5): qty 2

## 2020-03-30 MED ORDER — FLECAINIDE ACETATE 50 MG PO TABS
50.0000 mg | ORAL_TABLET | Freq: Two times a day (BID) | ORAL | Status: DC
  Administered 2020-03-30 – 2020-03-31 (×3): 50 mg via ORAL
  Filled 2020-03-30 (×4): qty 1

## 2020-03-30 MED ORDER — SODIUM CHLORIDE 0.9 % IV BOLUS
1000.0000 mL | Freq: Once | INTRAVENOUS | Status: AC
  Administered 2020-03-30: 1000 mL via INTRAVENOUS

## 2020-03-30 MED ORDER — INSULIN GLARGINE 100 UNIT/ML ~~LOC~~ SOLN
12.0000 [IU] | Freq: Every day | SUBCUTANEOUS | Status: DC
  Administered 2020-03-30: 12 [IU] via SUBCUTANEOUS
  Filled 2020-03-30 (×2): qty 0.12

## 2020-03-30 MED ORDER — PROMETHAZINE HCL 25 MG/ML IJ SOLN: 12.5000 mg | Freq: Four times a day (QID) | INTRAMUSCULAR | Status: DC | PRN

## 2020-03-30 MED ORDER — LIVING WELL WITH DIABETES BOOK
Freq: Once | Status: AC
  Filled 2020-03-30: qty 1

## 2020-03-30 MED ORDER — VANCOMYCIN HCL 750 MG/150ML IV SOLN
750.0000 mg | Freq: Two times a day (BID) | INTRAVENOUS | Status: DC
  Administered 2020-03-30 – 2020-03-31 (×3): 750 mg via INTRAVENOUS
  Filled 2020-03-30 (×4): qty 150

## 2020-03-30 MED ORDER — METOPROLOL TARTRATE 25 MG PO TABS
25.0000 mg | ORAL_TABLET | Freq: Two times a day (BID) | ORAL | Status: DC
  Administered 2020-03-30 – 2020-03-31 (×3): 25 mg via ORAL
  Filled 2020-03-30 (×3): qty 1

## 2020-03-30 MED ORDER — LACTATED RINGERS IV BOLUS
1000.0000 mL | Freq: Once | INTRAVENOUS | Status: AC
  Administered 2020-03-30: 1000 mL via INTRAVENOUS

## 2020-03-30 NOTE — Progress Notes (Addendum)
Inpatient Diabetes Program Recommendations  AACE/ADA: New Consensus Statement on Inpatient Glycemic Control (2015)  Target Ranges:  Prepandial:   less than 140 mg/dL      Peak postprandial:   less than 180 mg/dL (1-2 hours)      Critically ill patients:  140 - 180 mg/dL   Lab Results  Component Value Date   GLUCAP 211 (H) 03/30/2020   HGBA1C 12.6 (H) 03/30/2020    Review of Glycemic Control Results for Debra Andrews, Debra Andrews (MRN [id number]) as of 03/30/2020 10:50  Ref. Range 03/29/2020 23:35 03/30/2020 01:56 03/30/2020 08:14  Glucose-Capillary Latest Ref Range: 70 - 99 mg/dL 657 (H) 903 (H) 833 (H)   Diabetes history: DM 2 Outpatient Diabetes medications:  Lantus 12 units daily, Metformin 750 mg bid, Diabeta 1.25 mg bid Current orders for Inpatient glycemic control:  Novolog moderate tid with meals and HS Inpatient Diabetes Program Recommendations:   Please add Lantus 12 units daily.  Also consider adding Novolog 3 units tid with meals (hold if patient eats less than 50% or NPO).   Thanks,  Beryl Meager, RN, BC-ADM Inpatient Diabetes Coordinator Pager [phone] (8a-5p)  Addendum 1300: Spoke to patient regarding A1C=12.6% (average 318 mg/dL).  She states that she has been on insulin for approximately 6 years.  She admits that she sometimes forgets doses due to her work schedule.  Discussed that blood sugar control is very important to prevent complications including vascular, kidney, etc.  She states that she does not check her blood sugars routinely but when she checks, it is in 200-300 range.  Discussed normal blood sugar values of 80-130 mg/dL fasting and Less than 060 mg/dL after eating.   She likely needs intensification of diabetes medications.  Explained that glycemic control is also important for healing with infections, etc.  She states that she has an appt. At her PCP's office this week.  Lantus added today.  Will follow. Also will order patient LWWD booklet for educational  resource for patient.

## 2020-03-30 NOTE — Progress Notes (Addendum)
PROGRESS NOTE    Debra Andrews  RUE:[id number] DOB: Jul 05, 1970 DOA: 03/29/2020 PCP: Marylen Ponto, MD   Brief Narrative:  Patient is 49 year old female with past medical history of diabetes, hypertension, paroxysmal supraventricular tachycardia on antiarrhythmics and anxiety presents to emergency department with 3-day history of progressively worsening swelling of right eye.  ED course: Patient had fever of 103, tachycardic, WBC 11 300, lactic acid: WNL.  COVID-19 flu test: Negative.  Maxillofacial CT with contrast showed periorbital soft tissue swelling of the right orbit with no septal extension.  Patient started on Rocephin and vancomycin and IV fluid bolus in ED.  Patient admitted for further management of right periorbital cellulitis.    Assessment & Plan:   Sepsis secondary to preseptal cellulitis of right eye/facial cellulitis: -Patient meets sepsis criteria: Fever of 103, tachycardia, leukocytosis.  Lactic acid: WNL on admission. -Reviewed maxillofacial CT showing right periorbital cellulitis without post septal extension. -Continue IV fluids. -Patient started on IV Vanco and cefepime. -Overnight: Patient had fever with tachycardia with elevated lactic acid therefore ID and ophthalmology consult was placed. -PCT: 0.24.  Blood culture: No growth so far.  Continue to monitor fever curve and trend lactic acid and continue broad-spectrum antibiotics for now. -Discussed with ophthalmology-Dr. Porfilio via secure chat-recommended: He reviewed images-no post septal extension, likely preseptal cellulitis rather than orbital.  Can be treated with p.o. or IV antibiotics & cold compression unless its fungal infection since she is diabetic.  Continue IV antibiotics for now with possible improvement in 24 to 48 hours.  Follow-up with [facility] the day of or following her discharge & call him if any questions/concerns-appreciate input.  Uncontrolled type 2 diabetes mellitus: -A1c:  12.6 -Continue sliding scale insulin.  Appreciate diabetes coordinator help -Start patient on Lantus 12 units daily.  Continue to monitor blood sugar closely.  Paroxysmal supraventricular tachycardia/AV nodal tachycardia: -Continue metoprolol and flecainide  Depression/anxiety: Continue duloxetine  Hypertension: Blood pressure is soft.  Continue to hold lisinopril.  DVT prophylaxis: Lovenox Code Status: Full code Family Communication:  None present at bedside.  Plan of care discussed with patient in length and she verbalized understanding and agreed with it. Disposition Plan: Likely home in 2 days  Consultants:  ID Ophthalmology Procedures:  CT maxillofacial Antimicrobials:   Rocephin  Vanco  Cefepime  Status is: Inpatient  Dispo: The patient is from: Home              Anticipated d/c is to: home              Anticipated d/c date is: 2 days              Patient currently is not medically stable to d/c.   Subjective: Patient seen and examined.  Tells me that her swelling and pain is little bit better this morning.  She still unable to open her right eye.  Not sure about vision changes at this moment.  Denies generalized weakness, lethargic, nausea, vomiting, headache, lightheadedness or dizziness.  Objective: Vitals:   03/30/20 0524 03/30/20 0647 03/30/20 0740 03/30/20 0755  BP: 117/78 111/67 96/65 109/60  Pulse: (!) 126 (!) 107 (!) 101 98  Resp: 19 18 18 17   Temp: (!) 103.1 F (39.5 C) (!) 100.6 F (38.1 C) 99.4 F (37.4 C) 99 F (37.2 C)  TempSrc: Oral Oral Oral Oral  SpO2: 96% 96% 99% 98%  Weight:      Height:        Intake/Output Summary (Last  24 hours) at 03/30/2020 1038 Last data filed at 03/29/2020 2314 Gross per 24 hour  Intake 800 ml  Output --  Net 800 ml   Filed Weights   03/29/20 1925  Weight: 62.6 kg   . Examination:  General exam: Appears calm and comfortable, on room air, communicating well Face: Right eye and right side of face  swollen, nonerythematous.  No open wound/ulcer. Respiratory system: Clear to auscultation. Respiratory effort normal. Cardiovascular system: S1 & S2 heard, RRR. No JVD, murmurs, rubs, gallops or clicks. No pedal edema. Gastrointestinal system: Abdomen is nondistended, soft and nontender. No organomegaly or masses felt. Normal bowel sounds heard. Central nervous system: Alert and oriented. No focal neurological deficits. Extremities: Symmetric 5 x 5 power. Skin: No rashes, lesions or ulcers Psychiatry: Judgement and insight appear normal. Mood & affect appropriate.    Data Reviewed: I have personally reviewed following labs and imaging studies  CBC: Recent Labs  Lab 03/29/20 2047 03/30/20 0423  WBC 11.3* 8.2  NEUTROABS 10.2*  --   HGB 14.8 14.1  HCT 42.7 40.4  MCV 85.9 86.0  PLT 222 201   Basic Metabolic Panel: Recent Labs  Lab 03/29/20 2047 03/30/20 0423  NA 133* 136  K 3.7 4.0  CL 100 105  CO2 21* 24  GLUCOSE 221* 237*  BUN 10 9  CREATININE 0.45 0.47  CALCIUM 8.6* 8.1*   GFR: Estimated Creatinine Clearance: 67.7 mL/min (by C-G formula based on SCr of 0.47 mg/dL). Liver Function Tests: Recent Labs  Lab 03/29/20 2047  AST 20  ALT 17  ALKPHOS 126  BILITOT 1.5*  PROT 7.3  ALBUMIN 3.9   No results for input(s): LIPASE, AMYLASE in the last 168 hours. No results for input(s): AMMONIA in the last 168 hours. Coagulation Profile: Recent Labs  Lab 03/30/20 0423  INR 1.1   Cardiac Enzymes: No results for input(s): CKTOTAL, CKMB, CKMBINDEX, TROPONINI in the last 168 hours. BNP (last 3 results) No results for input(s): PROBNP in the last 8760 hours. HbA1C: Recent Labs    03/30/20 0423  HGBA1C 12.6*   CBG: Recent Labs  Lab 03/29/20 2335 03/30/20 0156 03/30/20 0814  GLUCAP 203* 246* 211*   Lipid Profile: No results for input(s): CHOL, HDL, LDLCALC, TRIG, CHOLHDL, LDLDIRECT in the last 72 hours. Thyroid Function Tests: No results for input(s): TSH,  T4TOTAL, FREET4, T3FREE, THYROIDAB in the last 72 hours. Anemia Panel: No results for input(s): VITAMINB12, FOLATE, FERRITIN, TIBC, IRON, RETICCTPCT in the last 72 hours. Sepsis Labs: Recent Labs  Lab 03/29/20 2047 03/30/20 0423  PROCALCITON  --  0.24  LATICACIDVEN 1.2 2.3*    Recent Results (from the past 240 hour(s))  Blood culture (routine x 2)     Status: None (Preliminary result)   Collection Time: 03/29/20  8:47 PM   Specimen: BLOOD  Result Value Ref Range Status   Specimen Description BLOOD RIGHT FOREARM  Final   Special Requests   Final    BOTTLES DRAWN AEROBIC AND ANAEROBIC Blood Culture results may not be optimal due to an inadequate volume of blood received in culture bottles   Culture   Final    NO GROWTH < 12 HOURS Performed at [facility], [address] [place], [place] [address]    Report Status PENDING  Incomplete  Respiratory Panel by RT PCR (Flu A&B, Covid) - Nasopharyngeal Swab     Status: None   Collection Time: 03/29/20  8:47 PM   Specimen: Nasopharyngeal Swab  Result Value Ref Range Status   SARS Coronavirus 2 by RT PCR NEGATIVE NEGATIVE Final    Comment: (NOTE) SARS-CoV-2 target nucleic acids are NOT DETECTED.  The SARS-CoV-2 RNA is generally detectable in upper respiratoy specimens during the acute phase of infection. The lowest concentration of SARS-CoV-2 viral copies this assay can detect is 131 copies/mL. A negative result does not preclude SARS-Cov-2 infection and should not be used as the sole basis for treatment or other patient management decisions. A negative result may occur with  improper specimen collection/handling, submission of specimen other than nasopharyngeal swab, presence of viral mutation(s) within the areas targeted by this assay, and inadequate number of viral copies (<131 copies/mL). A negative result must be combined with clinical observations, patient history, and epidemiological information. The expected result  is Negative.  Fact Sheet for Patients:  https://www.moore.com/  Fact Sheet for Healthcare Providers:  https://www.young.biz/  This test is no t yet approved or cleared by the [place] FDA and  has been authorized for detection and/or diagnosis of SARS-CoV-2 by FDA under an Emergency Use Authorization (EUA). This EUA will remain  in effect (meaning this test can be used) for the duration of the COVID-19 declaration under Section 564(b)(1) of the Act, 21 U.S.C. section 360bbb-3(b)(1), unless the authorization is terminated or revoked sooner.     Influenza A by PCR NEGATIVE NEGATIVE Final   Influenza B by PCR NEGATIVE NEGATIVE Final    Comment: (NOTE) The Xpert Xpress SARS-CoV-2/FLU/RSV assay is intended as an aid in  the diagnosis of influenza from Nasopharyngeal swab specimens and  should not be used as a sole basis for treatment. Nasal washings and  aspirates are unacceptable for Xpert Xpress SARS-CoV-2/FLU/RSV  testing.  Fact Sheet for Patients: https://www.moore.com/  Fact Sheet for Healthcare Providers: https://www.young.biz/  This test is not yet approved or cleared by the [place] FDA and  has been authorized for detection and/or diagnosis of SARS-CoV-2 by  FDA under an Emergency Use Authorization (EUA). This EUA will remain  in effect (meaning this test can be used) for the duration of the  Covid-19 declaration under Section 564(b)(1) of the Act, 21  U.S.C. section 360bbb-3(b)(1), unless the authorization is  terminated or revoked. Performed at [facility], [address] [place], [place] [address]   Blood culture (routine x 2)     Status: None (Preliminary result)   Collection Time: 03/29/20  8:48 PM   Specimen: BLOOD  Result Value Ref Range Status   Specimen Description BLOOD RIGHT HAND  Final   Special Requests   Final    BOTTLES DRAWN AEROBIC AND ANAEROBIC Blood  Culture adequate volume   Culture   Final    NO GROWTH < 12 HOURS Performed at [facility], [address], [place], [place] [address]    Report Status PENDING  Incomplete      Radiology Studies: CT Maxillofacial W Contrast  Result Date: 03/29/2020 CLINICAL DATA:  Right eye swelling EXAM: CT MAXILLOFACIAL WITH CONTRAST TECHNIQUE: Multidetector CT imaging of the maxillofacial structures was performed with intravenous contrast. Multiplanar CT image reconstructions were also generated. CONTRAST:  71mL OMNIPAQUE IOHEXOL 300 MG/ML  SOLN COMPARISON:  None. FINDINGS: Osseous: No fracture or mandibular dislocation. No destructive process. Orbits: Periorbital soft tissue swelling at the right orbit. No postseptal extension. Sinuses: Normal Soft tissues: Right periorbital soft tissue swelling but otherwise normal. Limited intracranial: Normal IMPRESSION: Right periorbital cellulitis without postseptal extension. Electronically Signed   By: Caryn Bee  Chase PicketHerman M.D.   On: 03/29/2020 22:56    Scheduled Meds: . enoxaparin (LOVENOX) injection  40 mg Subcutaneous Q24H  . insulin aspart  0-15 Units Subcutaneous TID WC  . insulin aspart  0-5 Units Subcutaneous QHS   Continuous Infusions: . ceFEPime (MAXIPIME) IV Stopped (03/30/20 0330)  . lactated ringers 150 mL/hr at 03/29/20 2318  . lactated ringers 125 mL/hr at 03/30/20 0203  . vancomycin 750 mg (03/30/20 0935)     LOS: 1 day   Time spent: 40 minutes   Luvenia Cranford Estill Cotta Veronda Gabor, MD [facility]  If 7PM-7AM, please contact night-coverage [email or web address] 03/30/2020, 10:38 AM

## 2020-03-30 NOTE — Progress Notes (Signed)
   03/30/20 1948  Assess: MEWS Score  Temp 98.4 F (36.9 C)  BP 126/76  Pulse Rate 97  Resp 17  Level of Consciousness Alert  SpO2 99 %  O2 Device Room Air  Patient Activity (if Appropriate) In bed  Assess: if the MEWS score is Yellow or Red  Were vital signs taken at a resting state? Yes  Focused Assessment No change from prior assessment  Early Detection of Sepsis Score *See Row Information* Low  MEWS guidelines implemented *See Row Information* No, vital signs rechecked  Vital signs moving toward improved parameters. Will continue to monitor.

## 2020-03-30 NOTE — Progress Notes (Signed)
   03/30/20 0647  Assess: MEWS Score  Temp (!) 100.6 F (38.1 C)  BP 111/67  Pulse Rate (!) 107  Resp 18  Level of Consciousness Alert  SpO2 96 %  O2 Device Room Air  Patient Activity (if Appropriate) In bed  Assess: MEWS Score  MEWS Temp 1  MEWS Systolic 0  MEWS Pulse 1  MEWS RR 0  MEWS LOC 0  MEWS Score 2  MEWS Score Color Yellow  Assess: if the MEWS score is Yellow or Red  Were vital signs taken at a resting state? Yes  Focused Assessment Change from prior assessment (see assessment flowsheet)  Treat  MEWS Interventions Administered scheduled meds/treatments  Pain Score 0  Patients response to intervention Decreased  Take Vital Signs  Increase Vital Sign Frequency  Yellow: Q 2hr X 2 then Q 4hr X 2, if remains yellow, continue Q 4hrs  Patient condition improved. Will report to new shift nurse.

## 2020-03-30 NOTE — Progress Notes (Signed)
Pharmacy Antibiotic Note  Debra Andrews is a 49 y.o. female admitted on 03/29/2020 with sepsis.  Pharmacy has been consulted for Vancomycin, Cefepime  dosing. CrCl = 67.7 ml/min   Plan: Vancomycin 1 gm IV X 1 given in ED on 11/15 @ 2200. Additional Vanc 500 mg IV X 1 ordered to make total loading dose of 1500 mg. Vancomycin 750 mg IV Q12H ordered to start on 11/16 @ 1000.  Cefepime 2 gm IV Q8H ordered to start on 11/16 @ ~ 0030.  Height: 4\' 11"  (149.9 cm) Weight: 62.6 kg (138 lb) IBW/kg (Calculated) : 43.2  Temp (24hrs), Avg:100.6 F (38.1 C), Min:99 F (37.2 C), Max:103 F (39.4 C)  Recent Labs  Lab 03/29/20 2047  WBC 11.3*  CREATININE 0.45  LATICACIDVEN 1.2    Estimated Creatinine Clearance: 67.7 mL/min (by C-G formula based on SCr of 0.45 mg/dL).    Allergies  Allergen Reactions  . Penicillins     Antimicrobials this admission:   >>    >>   Dose adjustments this admission:   Microbiology results:  BCx:   UCx:    Sputum:    MRSA PCR:   Thank you for allowing pharmacy to be a part of this patient's care.  Maan Zarcone D 03/30/2020 12:11 AM

## 2020-03-30 NOTE — Progress Notes (Signed)
Notified by patient's primary RN of worsening symptoms, fever 103.1, Tachy in the 130s and elevated lactic acid levels that appears to be trending up. Patient already on  Broad spectrum abx for periorbital cellulitis.   -Placed consult to Ophthalmology & ID -Trend Lactic -Monitor fever curve -IVFS and PRN bolus to keep MAP>65 mm Hg    Webb Silversmith, BSN, MSN, [employer], [employer]  [facility] Nurse Practitioner  [place] [facility]

## 2020-03-30 NOTE — Progress Notes (Signed)
RN administering bolus ordered by provider. Will continue to monitor. Patient condition.

## 2020-03-30 NOTE — Progress Notes (Signed)
CCMD called to inform that patient's hr in 160s. RN went to pts room to assess and found her standing up in bathroom over commode after vomiting episode. Will give her antiemetic and plan to monitor for effectiveness after admin.

## 2020-03-30 NOTE — Progress Notes (Signed)
   03/30/20 0524  Assess: MEWS Score  Temp (!) 103.1 F (39.5 C)  BP 117/78  Pulse Rate (!) 126  Resp 19  Level of Consciousness Alert  SpO2 96 %  O2 Device Room Air  Assess: MEWS Score  MEWS Temp 2  MEWS Systolic 0  MEWS Pulse 2  MEWS RR 0  MEWS LOC 0  MEWS Score 4  MEWS Score Color Red  Assess: if the MEWS score is Yellow or Red  Were vital signs taken at a resting state? Yes  Focused Assessment Change from prior assessment (see assessment flowsheet)  Early Detection of Sepsis Score *See Row Information* Medium  MEWS guidelines implemented *See Row Information* Yes  Treat  MEWS Interventions Escalated (See documentation below)  Pain Scale 0-10  Pain Score 0  Take Vital Signs  Increase Vital Sign Frequency  Red: Q 1hr X 4 then Q 4hr X 4, if remains red, continue Q 4hrs  Escalate  MEWS: Escalate Red: discuss with charge nurse/RN and provider, consider discussing with RRT  Notify: Charge Nurse/RN  Name of Charge Nurse/RN Notified Deborah, MSN  Date Charge Nurse/RN Notified 03/30/20  Time Charge Nurse/RN Notified 0528  Notify: Provider  Provider Name/Title Anna Genre, DNP  Date Provider Notified 03/30/20  Time Provider Notified [phone]  Notification Type Page  Notification Reason  (Critical lactic acid per Dahlia Client in alb)  Response  (awaiting orders)  Date of Provider Response 03/30/20  Time of Provider Response 0530  Will continue to monitor.

## 2020-03-30 NOTE — Plan of Care (Signed)
  Problem: Education: Goal: Knowledge of General Education information will improve Description: Including pain rating scale, medication(s)/side effects and non-pharmacologic comfort measures Outcome: Progressing   Problem: Clinical Measurements: Goal: Ability to maintain clinical measurements within normal limits will improve Outcome: Progressing Goal: Will remain free from infection Outcome: Progressing Goal: Diagnostic test results will improve Outcome: Progressing Goal: Respiratory complications will improve Outcome: Progressing Goal: Cardiovascular complication will be avoided Outcome: Progressing   Problem: Activity: Goal: Risk for activity intolerance will decrease Outcome: Progressing   Problem: Pain Managment: Goal: General experience of comfort will improve Outcome: Progressing   Problem: Safety: Goal: Ability to remain free from injury will improve Outcome: Progressing   

## 2020-03-31 DIAGNOSIS — L03213 Periorbital cellulitis: Secondary | ICD-10-CM | POA: Diagnosis not present

## 2020-03-31 LAB — GLUCOSE, CAPILLARY
Glucose-Capillary: 85 mg/dL (ref 70–99)
Glucose-Capillary: 200 mg/dL — ABNORMAL HIGH (ref 70–99)

## 2020-03-31 LAB — BASIC METABOLIC PANEL
Anion gap: 7 (ref 5–15)
BUN: <5 mg/dL — ABNORMAL LOW (ref 6–20)
CO2: 25 mmol/L (ref 22–32)
Calcium: 7.7 mg/dL — ABNORMAL LOW (ref 8.9–10.3)
Chloride: 106 mmol/L (ref 98–111)
Creatinine, Ser: 0.33 mg/dL — ABNORMAL LOW (ref 0.44–1.00)
GFR, Estimated: >60 mL/min (ref >60)
Glucose, Bld: 102 mg/dL — ABNORMAL HIGH (ref 70–99)
Potassium: 3.3 mmol/L — ABNORMAL LOW (ref 3.5–5.1)
Sodium: 138 mmol/L (ref 135–145)

## 2020-03-31 LAB — PROCALCITONIN: Procalcitonin: 0.35 ng/mL

## 2020-03-31 LAB — CBC
HCT: 34.6 % — ABNORMAL LOW (ref 36.0–46.0)
Hemoglobin: 12.1 g/dL (ref 12.0–15.0)
MCH: 30.1 pg (ref 26.0–34.0)
MCHC: 35 g/dL (ref 30.0–36.0)
MCV: 86.1 fL (ref 80.0–100.0)
Platelets: 183 10*3/uL (ref 150–400)
RBC: 4.02 MIL/uL (ref 3.87–5.11)
RDW: 11.3 % — ABNORMAL LOW (ref 11.5–15.5)
WBC: 5.7 10*3/uL (ref 4.0–10.5)
nRBC: 0 % (ref 0.0–0.2)

## 2020-03-31 LAB — HIV ANTIBODY (ROUTINE TESTING W REFLEX): HIV Screen 4th Generation wRfx: NONREACTIVE

## 2020-03-31 MED ORDER — KETOROLAC TROMETHAMINE 15 MG/ML IJ SOLN
15.0000 mg | Freq: Four times a day (QID) | INTRAMUSCULAR | Status: DC
  Administered 2020-03-31: 15 mg via INTRAVENOUS
  Filled 2020-03-31: qty 1

## 2020-03-31 MED ORDER — IBUPROFEN 400 MG PO TABS: 400.0000 mg | ORAL_TABLET | Freq: Three times a day (TID) | ORAL | Status: AC | PRN

## 2020-03-31 MED ORDER — HYDROCODONE-ACETAMINOPHEN 5-325 MG PO TABS: 1.0000 | ORAL_TABLET | ORAL | Status: DC | PRN

## 2020-03-31 MED ORDER — CEPHALEXIN 500 MG PO CAPS
500.0000 mg | ORAL_CAPSULE | Freq: Four times a day (QID) | ORAL | Status: DC
  Administered 2020-03-31: 500 mg via ORAL
  Filled 2020-03-31: qty 1

## 2020-03-31 MED ORDER — ACETAMINOPHEN 325 MG PO TABS: 650.0000 mg | ORAL_TABLET | Freq: Four times a day (QID) | ORAL | Status: DC | PRN

## 2020-03-31 MED ORDER — POTASSIUM CHLORIDE CRYS ER 20 MEQ PO TBCR
40.0000 meq | EXTENDED_RELEASE_TABLET | Freq: Once | ORAL | Status: AC
  Administered 2020-03-31: 40 meq via ORAL
  Filled 2020-03-31: qty 2

## 2020-03-31 MED ORDER — ACETAMINOPHEN 650 MG RE SUPP: 650.0000 mg | Freq: Four times a day (QID) | RECTAL | Status: DC | PRN

## 2020-03-31 MED ORDER — ACETAMINOPHEN 325 MG PO TABS: 650.0000 mg | ORAL_TABLET | Freq: Four times a day (QID) | ORAL | Status: AC | PRN

## 2020-03-31 MED ORDER — CEPHALEXIN 500 MG PO CAPS: 500.0000 mg | ORAL_CAPSULE | Freq: Four times a day (QID) | ORAL | 0 refills | Status: AC

## 2020-03-31 MED ORDER — HYDROCODONE-ACETAMINOPHEN 5-325 MG PO TABS
1.0000 | ORAL_TABLET | ORAL | 0 refills | Status: DC | PRN
Start: 2020-03-31 — End: 2020-12-28

## 2020-03-31 NOTE — Consult Note (Signed)
NAME: Debra Andrews  DOB: 07/08/1970  MRN: [id number]  Date/Time: 03/31/2020 9:59 AM  REQUESTING PROVIDER: Anna Genreuma Subjective:  REASON FOR CONSULT:  periorbital cellulitis ?Female with history of diabetes mellitus, hyperlipidemia, hypertension presented to the ED on 03/29/2020 with swelling on the right side of her face and eye and inability to open the eye and fever of 2 days duration. Patient works in a nursing home and a few days before her hospitalization she had noted headache on the right side and then some swelling on the right side of the forehead which gradually got worse.      It felt hot and then she started to feel weak.  She was unable to open her eye with significant eye swelling her husband drove her to the hospital.         Vitals in the ED were temperature 100.7 which increased to 103, BP of 108/77, heart rate of 108.  Labs revealed a creatinine of 0.45, glucose of 221, lactate of 1.2, WBC of 11.3, hemoglobin of 14.8 and platelet of 232.  Blood cultures were sent. She had a CT of her face which showed right periorbital cellulitis without post septal extension.  She was started on IV vancomycin and cefepime.  I am asked to see the patient for antibiotic recommendations on discharge. Patient states he is doing much better. Patient denies any preceding boils or lesions on her face.  She had waxed  her eyebrows on 03/19/2020 at the [facility].  Not noticed any cuts or injury to the area.   [place] and [facility].  Medical history AV nodal tachycardia Anxiety Diabetes mellitus Endometriosis Hyperlipidemia Hypertension Vulvar psoriasis  Past surgical history Left ovarian biopsy Tubal ligation   Social history Lives with her husband and her son's family lives with them. Non-smoker No alcohol or illicit drug use   Family history Rheumatoid arthritis sister Hyperlipidemia Sister Allergies  Allergen Reactions  .  Penicillins Rash    Localized rash that did not require hospitalization - per patient   Is not sure whether she has taken amoxicillin but thinks so  ? Current Facility-Administered Medications  Medication Dose Route Frequency Provider Last Rate Last Admin  . acetaminophen (TYLENOL) tablet 650 mg  650 mg Oral Q6H PRN Andris Baumannuncan, Hazel V, MD   650 mg at 03/31/20 [id number]   Or  . acetaminophen (TYLENOL) suppository 650 mg  650 mg Rectal Q6H PRN Andris Baumannuncan, Hazel V, MD      . ceFEPIme (MAXIPIME) 2 g in sodium chloride 0.9 % 100 mL IVPB  2 g Intravenous Q8H Andris Baumannuncan, Hazel V, MD 200 mL/hr at 03/31/20 0836 2 g at 03/31/20 0836  . DULoxetine (CYMBALTA) DR capsule 60 mg  60 mg Oral Daily Pahwani, Rinka R, MD   60 mg at 03/31/20 0956  . enoxaparin (LOVENOX) injection 40 mg  40 mg Subcutaneous Q24H Andris Baumannuncan, Hazel V, MD   40 mg at 03/31/20 0956  . flecainide (TAMBOCOR) tablet 50 mg  50 mg Oral Q12H Pahwani, Rinka R, MD   50 mg at 03/31/20 0957  . HYDROcodone-acetaminophen (NORCO/VICODIN) 5-325 MG per tablet 1-2 tablet  1-2 tablet Oral Q4H PRN Sreenath, Sudheer B, MD      . insulin aspart (novoLOG) injection 0-15 Units  0-15 Units Subcutaneous TID WC Andris Baumannuncan, Hazel V, MD   3 Units at 03/30/20 1725  . insulin aspart (novoLOG) injection 0-5 Units  0-5 Units Subcutaneous QHS Andris Baumannuncan, Hazel V, MD  2 Units at 03/30/20 0201  . insulin glargine (LANTUS) injection 12 Units  12 Units Subcutaneous Daily Pahwani, Kasandra Knudsen, MD   12 Units at 03/30/20 2122  . ketorolac (TORADOL) 15 MG/ML injection 15 mg  15 mg Intravenous Q6H Sreenath, Sudheer B, MD      . lactated ringers infusion   Intravenous Continuous Andris Baumann, MD 125 mL/hr at 03/30/20 1844 New Bag at 03/30/20 1844  . metoprolol tartrate (LOPRESSOR) tablet 25 mg  25 mg Oral BID Pahwani, Rinka R, MD   25 mg at 03/31/20 0957  . ondansetron (ZOFRAN) tablet 4 mg  4 mg Oral Q6H PRN Andris Baumann, MD       Or  . ondansetron [facility]) injection 4 mg  4 mg Intravenous Q6H PRN  Andris Baumann, MD   4 mg at 03/30/20 1404  . promethazine (PHENERGAN) injection 12.5 mg  12.5 mg Intravenous Q6H PRN Pahwani, Rinka R, MD      . vancomycin (VANCOREADY) IVPB 750 mg/150 mL  750 mg Intravenous Q12H Andris Baumann, MD 150 mL/hr at 03/30/20 2118 750 mg at 03/30/20 2118     Abtx:  Anti-infectives (From admission, onward)   Start     Dose/Rate Route Frequency Ordered Stop   03/30/20 1000  vancomycin (VANCOREADY) IVPB 750 mg/150 mL        750 mg 150 mL/hr over 60 Minutes Intravenous Every 12 hours 03/30/20 0011     03/30/20 0015  ceFEPIme (MAXIPIME) 2 g in sodium chloride 0.9 % 100 mL IVPB        2 g 200 mL/hr over 30 Minutes Intravenous Every 8 hours 03/30/20 0006     03/29/20 2330  vancomycin (VANCOREADY) IVPB 500 mg/100 mL        500 mg 100 mL/hr over 60 Minutes Intravenous  Once 03/29/20 2327 03/30/20 0425   03/29/20 2315  vancomycin (VANCOCIN) IVPB 1000 mg/200 mL premix  Status:  Discontinued        1,000 mg 200 mL/hr over 60 Minutes Intravenous  Once 03/29/20 2313 03/29/20 2326   03/29/20 2015  vancomycin (VANCOCIN) IVPB 1000 mg/200 mL premix        1,000 mg 200 mL/hr over 60 Minutes Intravenous  Once 03/29/20 2010 03/29/20 2314   03/29/20 2015  cefTRIAXone (ROCEPHIN) 2 g in sodium chloride 0.9 % 100 mL IVPB        2 g 200 mL/hr over 30 Minutes Intravenous  Once 03/29/20 2010 03/29/20 2153      REVIEW OF SYSTEMS:  Const:  fever, negative chills, negative weight loss Eyes: Swelling of right eyelid with pain on the orbital area ENT: negative coryza, negative sore throat Resp: negative cough, hemoptysis, dyspnea Cards: negative for chest pain, palpitations, lower extremity edema GU: negative for frequency, dysuria and hematuria GI: Negative for abdominal pain, diarrhea, bleeding, constipation Skin: negative for rash and pruritus Heme: negative for easy bruising and gum/nose bleeding MS: negative for myalgias, arthralgias, back pain and muscle  weakness Neurolo:negative for headaches, dizziness, vertigo, memory problems  Psych: Anxiety Endocrine: Has diabetes Allergy/Immunology-penicillin rash Objective:  VITALS:  BP 125/79 (BP Location: Right Arm)   Pulse 85   Temp 98.7 F (37.1 C) (Oral)   Resp 15   Ht 4\' 11"  (1.499 m)   Wt 62.6 kg   SpO2 97%   BMI 27.87 kg/m  PHYSICAL EXAM:  General: Alert, cooperative, no distress, appears stated age.  Head: Normocephalic, without obvious abnormality, atraumatic no swelling  of eyelids no erythema. Eyes: Conjunctivae clear, anicteric sclerae. Pupils are equal  ENT Nares normal. No drainage or sinus tenderness. Lips, mucosa, and tongue normal. No Thrush Neck: Supple, symmetrical, no adenopathy, thyroid: non tender no carotid bruit and no JVD. Back: No CVA tenderness. Lungs: Clear to auscultation bilaterally. No Wheezing or Rhonchi. No rales. Heart: Regular rate and rhythm, no murmur, rub or gallop. Abdomen: Soft, non-tender,not distended. Bowel sounds normal. No masses Extremities: atraumatic, no cyanosis. No edema. No clubbing Skin: No rashes or lesions. Or bruising Lymph: Cervical, supraclavicular normal. Neurologic: Grossly non-focal Pertinent Labs Lab Results CBC    Component Value Date/Time   WBC 5.7 03/31/2020 0409   RBC 4.02 03/31/2020 0409   HGB 12.1 03/31/2020 0409   HCT 34.6 (L) 03/31/2020 0409   PLT 183 03/31/2020 0409   MCV 86.1 03/31/2020 0409   MCH 30.1 03/31/2020 0409   MCHC 35.0 03/31/2020 0409   RDW 11.3 (L) 03/31/2020 0409   LYMPHSABS 0.5 (L) 03/29/2020 2047   MONOABS 0.5 03/29/2020 2047   EOSABS 0.0 03/29/2020 2047   BASOSABS 0.0 03/29/2020 2047    CMP Latest Ref Rng & Units 03/31/2020 03/30/2020 03/29/2020  Glucose 70 - 99 mg/dL 315(Q) 008(Q) 761(P)  BUN 6 - 20 mg/dL <5(K) 9 10  Creatinine 0.44 - 1.00 mg/dL 9.32(I) 7.12 4.58  Sodium 135 - 145 mmol/L 138 136 133(L)  Potassium 3.5 - 5.1 mmol/L 3.3(L) 4.0 3.7  Chloride 98 - 111 mmol/L 106 105  100  CO2 22 - 32 mmol/L 25 24 21(L)  Calcium 8.9 - 10.3 mg/dL 7.7(L) 8.1(L) 8.6(L)  Total Protein 6.5 - 8.1 g/dL - - 7.3  Total Bilirubin 0.3 - 1.2 mg/dL - - 1.5(H)  Alkaline Phos 38 - 126 U/L - - 126  AST 15 - 41 U/L - - 20  ALT 0 - 44 U/L - - 17      Microbiology: Recent Results (from the past 240 hour(s))  Blood culture (routine x 2)     Status: None (Preliminary result)   Collection Time: 03/29/20  8:47 PM   Specimen: BLOOD  Result Value Ref Range Status   Specimen Description BLOOD RIGHT FOREARM  Final   Special Requests   Final    BOTTLES DRAWN AEROBIC AND ANAEROBIC Blood Culture results may not be optimal due to an inadequate volume of blood received in culture bottles   Culture   Final    NO GROWTH 2 DAYS Performed at [facility], [address] [place], [place] [address]    Report Status PENDING  Incomplete  Respiratory Panel by RT PCR (Flu A&B, Covid) - Nasopharyngeal Swab     Status: None   Collection Time: 03/29/20  8:47 PM   Specimen: Nasopharyngeal Swab  Result Value Ref Range Status   SARS Coronavirus 2 by RT PCR NEGATIVE NEGATIVE Final    Comment: (NOTE) SARS-CoV-2 target nucleic acids are NOT DETECTED.  The SARS-CoV-2 RNA is generally detectable in upper respiratoy specimens during the acute phase of infection. The lowest concentration of SARS-CoV-2 viral copies this assay can detect is 131 copies/mL. A negative result does not preclude SARS-Cov-2 infection and should not be used as the sole basis for treatment or other patient management decisions. A negative result may occur with  improper specimen collection/handling, submission of specimen other than nasopharyngeal swab, presence of viral mutation(s) within the areas targeted by this assay, and inadequate number of viral copies (<131 copies/mL). A negative result must be combined  with clinical observations, patient history, and epidemiological information. The expected result is  Negative.  Fact Sheet for Patients:  https://www.moore.com/  Fact Sheet for Healthcare Providers:  https://www.young.biz/  This test is no t yet approved or cleared by the [place] FDA and  has been authorized for detection and/or diagnosis of SARS-CoV-2 by FDA under an Emergency Use Authorization (EUA). This EUA will remain  in effect (meaning this test can be used) for the duration of the COVID-19 declaration under Section 564(b)(1) of the Act, 21 U.S.C. section 360bbb-3(b)(1), unless the authorization is terminated or revoked sooner.     Influenza A by PCR NEGATIVE NEGATIVE Final   Influenza B by PCR NEGATIVE NEGATIVE Final    Comment: (NOTE) The Xpert Xpress SARS-CoV-2/FLU/RSV assay is intended as an aid in  the diagnosis of influenza from Nasopharyngeal swab specimens and  should not be used as a sole basis for treatment. Nasal washings and  aspirates are unacceptable for Xpert Xpress SARS-CoV-2/FLU/RSV  testing.  Fact Sheet for Patients: https://www.moore.com/  Fact Sheet for Healthcare Providers: https://www.young.biz/  This test is not yet approved or cleared by the [place] FDA and  has been authorized for detection and/or diagnosis of SARS-CoV-2 by  FDA under an Emergency Use Authorization (EUA). This EUA will remain  in effect (meaning this test can be used) for the duration of the  Covid-19 declaration under Section 564(b)(1) of the Act, 21  U.S.C. section 360bbb-3(b)(1), unless the authorization is  terminated or revoked. Performed at [facility], [address] [place], [place] [address]   Blood culture (routine x 2)     Status: None (Preliminary result)   Collection Time: 03/29/20  8:48 PM   Specimen: BLOOD  Result Value Ref Range Status   Specimen Description BLOOD RIGHT HAND  Final   Special Requests   Final    BOTTLES DRAWN AEROBIC AND ANAEROBIC Blood  Culture adequate volume   Culture   Final    NO GROWTH 2 DAYS Performed at [facility], [address] [place], [place] [address]    Report Status PENDING  Incomplete  MRSA PCR Screening     Status: None   Collection Time: 03/30/20  2:52 PM   Specimen: Nasal Mucosa; Nasopharyngeal  Result Value Ref Range Status   MRSA by PCR NEGATIVE NEGATIVE Final    Comment:        The GeneXpert MRSA Assay (FDA approved for NASAL specimens only), is one component of a comprehensive MRSA colonization surveillance program. It is not intended to diagnose MRSA infection nor to guide or monitor treatment for MRSA infections. Performed at [facility], [address] [place], [place] [address]     IMAGING RESULTS: CT maxilla reviewed personally Periorbital cellulitis I have personally reviewed the films ? Impression/Recommendation ?Periorbital cellulitis rt eye which has resolved completely with IV vancomycin and cefepime. The common organisms are Streptococcus or Staphylococcus.  There was no purulence. MRSA nares negative .  So we can discontinue the vancomycin cefepime and she could be discharged on Keflex for another 5 days.  Diabetes mellitus: Currently on insulin  AV nodal tachycardia.  On flecainide and metoprolol ? ? ___________________________________________________ Discussed with patient, requesting provider Note:  This document was prepared using Dragon voice recognition software and may include unintentional dictation errors.

## 2020-03-31 NOTE — Progress Notes (Signed)
Pt discharged to home.  IV removed without complication.  AVS given to pt and explained with no further questions.  All belongings at bedside taken with pt.  Pt transported off unit via WC  

## 2020-03-31 NOTE — Discharge Instructions (Signed)
Preseptal Cellulitis, Adult ° °Preseptal cellulitis is an infection of the eyelid and the tissues around the eye (periorbital area). The infection causes painful swelling and redness. This condition may also be called periorbital cellulitis. °In most cases, the condition can be treated with antibiotic medicine at home. It is important to treat preseptal cellulitis right away so that it does not get worse. If it gets worse, it can spread to the eye socket and eye muscles (orbital cellulitis). Orbital cellulitis is a medical emergency. °What are the causes? °Preseptal cellulitis is most commonly caused by bacteria. In rare cases, it can be caused by a virus or fungus. The germs that cause preseptal cellulitis may come from: °· A sinus infection that spreads near the eyes. °· An injury near the eye, such as a scratch, animal bite, or insect bite. °· A skin rash that becomes infected, such as eczema or poison ivy. °· An infected pimple on the eyelid (stye). °· Infection after eyelid surgery or injury. °What increases the risk? °You are more likely to develop this condition if: °· You have a weakened disease-fighting system (immune system). °· You have a medical condition that raises your risk for sinus infections, such as nasal polyps. °What are the signs or symptoms? °Symptoms of this condition usually develop suddenly. Symptoms may include: °· Eyelids that are red and swollen and feel unusually hot. °· Fever. °· Difficulty opening the eye. °· Headache. °· Facial pain. °How is this diagnosed? °This condition may be diagnosed based on your symptoms, your medical history, and an eye exam. You may have tests, such as: °· Blood tests. °· CT scan. °· MRI. °How is this treated? °This condition is treated with antibiotic medicines. These may be given by mouth (orally), through an IV, or as a shot. In rare cases, you may need surgery to drain an infected area. °Follow these instructions at home: °Medicines °· If you were  prescribed an antibiotic to take at home, take it as told by your health care provider. Do not stop taking the antibiotic even if you start to feel better. °· Take over-the-counter and prescription medicines only as told by your health care provider. °Eye Care °· Do not use eye drops without first getting approval from your health care provider. °· Do not touch or rub your eye. If you wear contact lenses, do not wear them until your health care provider approves. °· Keep the eye area clean and dry. °· Wash the eye area with a clean washcloth, warm water, and baby shampoo or mild soap. °· To help relieve discomfort, place a clean washcloth that is wet with warm water over your eye. Leave the washcloth on for a few minutes, then remove it. °General instructions °· Wash your hands with soap and water often. If soap and water are not available, use hand sanitizer. °· Do not use any products that contain nicotine or tobacco, such as cigarettes and e-cigarettes. If you need help quitting, ask your health care provider. °· Drink enough fluid to keep your urine pale yellow. °· Ask your health care provider if it is safe for you to drive. °· Stay up to date on your vaccinations. °· Keep all follow-up visits as told by your health care provider. This includes any visits with an eye specialist (ophthalmologist) or dentist. This is important. °Get help right away if: °· You have new symptoms. °· Your symptoms get worse or do not get better with treatment. °· You have a fever. °·   Your vision becomes blurry or gets worse in any way. °· Your eye looks like it is sticking out or bulging out (proptosis). °· You have trouble moving your eyes. °· You have a severe headache. °· You have neck stiffness or severe neck pain. °Summary °· Preseptal cellulitis is an infection of the eyelid and the tissues around the eye. °· Symptoms of preseptal cellulitis usually develop suddenly and include red and swollen eyelids, fever, difficulty  opening the eye, headache, and facial pain. °· This condition is treated with antibiotic medicines. Do not stop taking the antibiotic even if you start to feel better. °This information is not intended to replace advice given to you by your health care provider. Make sure you discuss any questions you have with your health care provider. °Document Revised: 04/13/2017 Document Reviewed: 02/21/2017 °Elsevier Patient Education © 2020 [employer] ° °

## 2020-03-31 NOTE — Discharge Summary (Signed)
Physician Discharge Summary  Debra Andrews XLK:[id number] DOB: 12-28-1970 DOA: 03/29/2020  PCP: Marylen Ponto, MD  Admit date: 03/29/2020 Discharge date: 03/31/2020  Admitted From: Home Disposition:  Home  Recommendations for Outpatient Follow-up:  1. Follow up with PCP in 1-2 weeks 2. Follow up with ophthalmology the day following hospital discharge, 04/01/2020  Home Health: No Equipment/Devices: None Discharge Condition: Stable CODE STATUS: Full Diet recommendation: Heart Healthy / Carb Modified  Brief/Interim Summary: Patient is 49 year old female with past medical history of diabetes, hypertension, paroxysmal supraventricular tachycardia on antiarrhythmics and anxiety presents to emergency department with 3-day history of progressively worsening swelling of right eye.  ED course: Patient had fever of 103, tachycardic, WBC 11 300, lactic acid: WNL.  COVID-19 flu test: Negative.  Maxillofacial CT with contrast showed periorbital soft tissue swelling of the right orbit with no septal extension.  Patient started on Rocephin and vancomycin and IV fluid bolus in ED.  Patient admitted for further management of right periorbital cellulitis.    Maintain on broad-spectrum antibiotics for duration of hospital course.  Ophthalmology contacted via phone.  Reviewed CT maxillofacial demonstrating right periorbital cellulitis without post septal extension or abscess formation.  Recommend continuation of broad-spectrum antibiotics while in house, transition to oral antibiotics and follow-up in ophthalmology clinic the day following hospital discharge.  Patient was seen by infectious disease during hospital course.  As patient's exam improved substantially recommended transition to p.o. Keflex to complete 5 additional days post discharge.  Patient stable at time of discharge.  Vision intact.  Able to move eyes without pain.  Visual acuity intact.  Stable for discharge at this time.  Discharge  Diagnoses:  Principal Problem:   Preseptal cellulitis of right eye Active Problems:   Type 2 diabetes mellitus without complication, with long-term current use of insulin (HCC)   Paroxysmal supraventricular tachycardia (HCC)   Anxiety disorder   Essential hypertension   Sepsis (HCC)  Sepsis secondary to preseptal cellulitis of right eye/facial cellulitis -Patient meets sepsis criteria: Fever of 103, tachycardia, leukocytosis.  Lactic acid: WNL on admission. -Reviewed maxillofacial CT showing right periorbital cellulitis without post septal extension. -Ophthalmology contacted.  Reviewed images.  Likely preseptal cellulitis -Maintain on broad-spectrum antibiotics during admission -All cultures remain no growth -Seen by infectious disease on day of discharge -Recommend transition to p.o. Keflex to complete 5 additional days -Stable for discharge at this time -Follow-up with ophthalmology 1 day post discharge -As needed pain control -Sepsis physiology resolved  Uncontrolled type 2 diabetes mellitus: -A1c: 12.6 -Educated patient importance of glycemic control -Likely underlying factor to development of preseptal cellulitis -Resume home regimen -Outpatient PCP follow-up  Paroxysmal supraventricular tachycardia/AV nodal tachycardia: -Continue metoprolol and flecainide  Depression/anxiety: Continue duloxetine  Hypertension: Can resume home lisinopril on discharge  Discharge Instructions  Discharge Instructions    Diet - low sodium heart healthy   Complete by: As directed    Increase activity slowly   Complete by: As directed      Allergies as of 03/31/2020      Reactions   Penicillins Rash   Localized rash that did not require hospitalization - per patient       Medication List    STOP taking these medications   sulfamethoxazole-trimethoprim 800-160 MG tablet Commonly known as: BACTRIM DS   valACYclovir 500 MG tablet Commonly known as: VALTREX     TAKE these  medications   acetaminophen 325 MG tablet Commonly known as: TYLENOL Take 2 tablets (650 mg total) by  mouth every 6 (six) hours as needed for mild pain or moderate pain (or Fever >/= 101).   Alcohol Wipes 70 % Pads Apply topically.   BD Veo Insulin Syr U/F 1/2Unit 31G X 15/64" 0.3 ML Misc Generic drug: Insulin Syringe-Needle U-100 Inject into the skin.   cephALEXin 500 MG capsule Commonly known as: KEFLEX Take 1 capsule (500 mg total) by mouth every 6 (six) hours for 5 days.   DULoxetine 60 MG capsule Commonly known as: CYMBALTA Take 60 mg by mouth daily.   flecainide 50 MG tablet Commonly known as: TAMBOCOR Take 50 mg by mouth 2 (two) times daily.   glyBURIDE 1.25 MG tablet Commonly known as: DIABETA Take 1.25 mg by mouth 2 (two) times daily.   HYDROcodone-acetaminophen 5-325 MG tablet Commonly known as: NORCO/VICODIN Take 1-2 tablets by mouth every 4 (four) hours as needed for severe pain.   ibuprofen 400 MG tablet Commonly known as: ADVIL Take 1 tablet (400 mg total) by mouth every 8 (eight) hours as needed for mild pain or moderate pain.   insulin glargine 100 UNIT/ML injection Commonly known as: LANTUS Inject 12 Units into the skin.   lisinopril 10 MG tablet Commonly known as: ZESTRIL Take 10 mg by mouth daily.   meloxicam 15 MG tablet Commonly known as: MOBIC Take 15 mg by mouth daily.   metFORMIN 750 MG 24 hr tablet Commonly known as: GLUCOPHAGE-XR Take 750 mg by mouth 2 (two) times daily.   metoprolol tartrate 25 MG tablet Commonly known as: LOPRESSOR Take 25 mg by mouth 2 (two) times daily.   Precision QID Test test strip Generic drug: glucose blood Check 3 times daily       Follow-up Information    Marylen Ponto, MD. Schedule an appointment as soon as possible for a visit in 1 week(s).   Specialty: Family Medicine Contact information: [address] [place] [address] [phone]        Galen Manila, MD Follow up.    Specialty: Ophthalmology Why: Follow up in Dr. Gerome Sam clinic the day following discharge from the hospital.  Please call the office first thing tomorrow AM and let them know you were instructed to follow up after hospital discharge Contact information: [address] [facility] [place] [address] [phone]              Allergies  Allergen Reactions  . Penicillins Rash    Localized rash that did not require hospitalization - per patient     Consultations:  Infectious disease   Procedures/Studies: CT Maxillofacial W Contrast  Result Date: 03/29/2020 CLINICAL DATA:  Right eye swelling EXAM: CT MAXILLOFACIAL WITH CONTRAST TECHNIQUE: Multidetector CT imaging of the maxillofacial structures was performed with intravenous contrast. Multiplanar CT image reconstructions were also generated. CONTRAST:  78mL OMNIPAQUE IOHEXOL 300 MG/ML  SOLN COMPARISON:  None. FINDINGS: Osseous: No fracture or mandibular dislocation. No destructive process. Orbits: Periorbital soft tissue swelling at the right orbit. No postseptal extension. Sinuses: Normal Soft tissues: Right periorbital soft tissue swelling but otherwise normal. Limited intracranial: Normal IMPRESSION: Right periorbital cellulitis without postseptal extension. Electronically Signed   By: Deatra Robinson M.D.   On: 03/29/2020 22:56    (Echo, Carotid, EGD, Colonoscopy, ERCP)    Subjective: Seen and examined on day of discharge.  Visual acuity intact.  Periorbital swelling much improved.  Discharge Exam: Vitals:   03/31/20 1140 03/31/20 1459  BP: (!) 144/88 131/83  Pulse: 86 73  Resp: 15 15  Temp: 98.4 F (  36.9 C) 98.6 F (37 C)  SpO2: 97% 98%   Vitals:   03/31/20 0428 03/31/20 0757 03/31/20 1140 03/31/20 1459  BP: 123/72 125/79 (!) 144/88 131/83  Pulse: 83 85 86 73  Resp:  15 15 15   Temp: 99.3 F (37.4 C) 98.7 F (37.1 C) 98.4 F (36.9 C) 98.6 F (37 C)  TempSrc: Oral Oral Oral Oral  SpO2: 98% 97% 97% 98%   Weight:      Height:        General: Pt is alert, awake, not in acute distress EENT: Right periorbital cellulitis, improved from prior Cardiovascular: RRR, S1/S2 +, no rubs, no gallops Respiratory: CTA bilaterally, no wheezing, no rhonchi Abdominal: Soft, NT, ND, bowel sounds + Extremities: no edema, no cyanosis    The results of significant diagnostics from this hospitalization (including imaging, microbiology, ancillary and laboratory) are listed below for reference.     Microbiology: Recent Results (from the past 240 hour(s))  Blood culture (routine x 2)     Status: None (Preliminary result)   Collection Time: 03/29/20  8:47 PM   Specimen: BLOOD  Result Value Ref Range Status   Specimen Description BLOOD RIGHT FOREARM  Final   Special Requests   Final    BOTTLES DRAWN AEROBIC AND ANAEROBIC Blood Culture results may not be optimal due to an inadequate volume of blood received in culture bottles   Culture   Final    NO GROWTH 2 DAYS Performed at [facility], [address], [place], [place]    Report Status PENDING  Incomplete  Respiratory Panel by RT PCR (Flu A&B, Covid) - Nasopharyngeal Swab     Status: None   Collection Time: 03/29/20  8:47 PM   Specimen: Nasopharyngeal Swab  Result Value Ref Range Status   SARS Coronavirus 2 by RT PCR NEGATIVE NEGATIVE Final    Comment: (NOTE) SARS-CoV-2 target nucleic acids are NOT DETECTED.  The SARS-CoV-2 RNA is generally detectable in upper respiratoy specimens during the acute phase of infection. The lowest concentration of SARS-CoV-2 viral copies this assay can detect is 131 copies/mL. A negative result does not preclude SARS-Cov-2 infection and should not be used as the sole basis for treatment or other patient management decisions. A negative result may occur with  improper specimen collection/handling, submission of specimen other than nasopharyngeal swab, presence of viral mutation(s) within  the areas targeted by this assay, and inadequate number of viral copies (<131 copies/mL). A negative result must be combined with clinical observations, patient history, and epidemiological information. The expected result is Negative.  Fact Sheet for Patients:  03/31/20  Fact Sheet for Healthcare Providers:  https://www.moore.com/  This test is no t yet approved or cleared by the https://www.young.biz/ FDA and  has been authorized for detection and/or diagnosis of SARS-CoV-2 by FDA under an Emergency Use Authorization (EUA). This EUA will remain  in effect (meaning this test can be used) for the duration of the COVID-19 declaration under Section 564(b)(1) of the Act, 21 U.S.C. section 360bbb-3(b)(1), unless the authorization is terminated or revoked sooner.     Influenza A by PCR NEGATIVE NEGATIVE Final   Influenza B by PCR NEGATIVE NEGATIVE Final    Comment: (NOTE) The Xpert Xpress SARS-CoV-2/FLU/RSV assay is intended as an aid in  the diagnosis of influenza from Nasopharyngeal swab specimens and  should not be used as a sole basis for treatment. Nasal washings and  aspirates are unacceptable for Xpert Xpress SARS-CoV-2/FLU/RSV  testing.  Fact Sheet for Patients: https://www.moore.com/  Fact Sheet for Healthcare Providers: https://www.young.biz/  This test is not yet approved or cleared by the [place] FDA and  has been authorized for detection and/or diagnosis of SARS-CoV-2 by  FDA under an Emergency Use Authorization (EUA). This EUA will remain  in effect (meaning this test can be used) for the duration of the  Covid-19 declaration under Section 564(b)(1) of the Act, 21  U.S.C. section 360bbb-3(b)(1), unless the authorization is  terminated or revoked. Performed at [facility], [address] [place], [place] [address]   Blood culture (routine x 2)     Status: None  (Preliminary result)   Collection Time: 03/29/20  8:48 PM   Specimen: BLOOD  Result Value Ref Range Status   Specimen Description BLOOD RIGHT HAND  Final   Special Requests   Final    BOTTLES DRAWN AEROBIC AND ANAEROBIC Blood Culture adequate volume   Culture   Final    NO GROWTH 2 DAYS Performed at [facility], [address], [place], [place] [address]    Report Status PENDING  Incomplete  MRSA PCR Screening     Status: None   Collection Time: 03/30/20  2:52 PM   Specimen: Nasal Mucosa; Nasopharyngeal  Result Value Ref Range Status   MRSA by PCR NEGATIVE NEGATIVE Final    Comment:        The GeneXpert MRSA Assay (FDA approved for NASAL specimens only), is one component of a comprehensive MRSA colonization surveillance program. It is not intended to diagnose MRSA infection nor to guide or monitor treatment for MRSA infections. Performed at [facility], [address] [place], [place] [address]      Labs: BNP (last 3 results) No results for input(s): BNP in the last 8760 hours. Basic Metabolic Panel: Recent Labs  Lab 03/29/20 2047 03/30/20 0423 03/31/20 0409  NA 133* 136 138  K 3.7 4.0 3.3*  CL 100 105 106  CO2 21* 24 25  GLUCOSE 221* 237* 102*  BUN 10 9 <5*  CREATININE 0.45 0.47 0.33*  CALCIUM 8.6* 8.1* 7.7*   Liver Function Tests: Recent Labs  Lab 03/29/20 2047  AST 20  ALT 17  ALKPHOS 126  BILITOT 1.5*  PROT 7.3  ALBUMIN 3.9   No results for input(s): LIPASE, AMYLASE in the last 168 hours. No results for input(s): AMMONIA in the last 168 hours. CBC: Recent Labs  Lab 03/29/20 2047 03/30/20 0423 03/30/20 1116 03/31/20 0409  WBC 11.3* 8.2 6.8 5.7  NEUTROABS 10.2*  --   --   --   HGB 14.8 14.1 12.5 12.1  HCT 42.7 40.4 35.2* 34.6*  MCV 85.9 86.0 85.2 86.1  PLT 222 201 171 183   Cardiac Enzymes: No results for input(s): CKTOTAL, CKMB, CKMBINDEX, TROPONINI in the last 168 hours. BNP: Invalid input(s):  POCBNP CBG: Recent Labs  Lab 03/30/20 1202 03/30/20 1630 03/30/20 2111 03/31/20 0756 03/31/20 1139  GLUCAP 193* 194* 124* 85 200*   D-Dimer No results for input(s): DDIMER in the last 72 hours. Hgb A1c Recent Labs    03/30/20 0423  HGBA1C 12.6*   Lipid Profile No results for input(s): CHOL, HDL, LDLCALC, TRIG, CHOLHDL, LDLDIRECT in the last 72 hours. Thyroid function studies No results for input(s): TSH, T4TOTAL, T3FREE, THYROIDAB in the last 72 hours.  Invalid input(s): FREET3 Anemia work up No results for input(s): VITAMINB12, FOLATE, FERRITIN, TIBC, IRON, RETICCTPCT in the last 72 hours. Urinalysis No results  found for: COLORURINE, APPEARANCEUR, LABSPEC, PHURINE, GLUCOSEU, HGBUR, BILIRUBINUR, KETONESUR, PROTEINUR, UROBILINOGEN, NITRITE, LEUKOCYTESUR Sepsis Labs Invalid input(s): PROCALCITONIN,  WBC,  LACTICIDVEN Microbiology Recent Results (from the past 240 hour(s))  Blood culture (routine x 2)     Status: None (Preliminary result)   Collection Time: 03/29/20  8:47 PM   Specimen: BLOOD  Result Value Ref Range Status   Specimen Description BLOOD RIGHT FOREARM  Final   Special Requests   Final    BOTTLES DRAWN AEROBIC AND ANAEROBIC Blood Culture results may not be optimal due to an inadequate volume of blood received in culture bottles   Culture   Final    NO GROWTH 2 DAYS Performed at [facility], [address] [place], [place] [address]    Report Status PENDING  Incomplete  Respiratory Panel by RT PCR (Flu A&B, Covid) - Nasopharyngeal Swab     Status: None   Collection Time: 03/29/20  8:47 PM   Specimen: Nasopharyngeal Swab  Result Value Ref Range Status   SARS Coronavirus 2 by RT PCR NEGATIVE NEGATIVE Final    Comment: (NOTE) SARS-CoV-2 target nucleic acids are NOT DETECTED.  The SARS-CoV-2 RNA is generally detectable in upper respiratoy specimens during the acute phase of infection. The lowest concentration of SARS-CoV-2 viral copies this  assay can detect is 131 copies/mL. A negative result does not preclude SARS-Cov-2 infection and should not be used as the sole basis for treatment or other patient management decisions. A negative result may occur with  improper specimen collection/handling, submission of specimen other than nasopharyngeal swab, presence of viral mutation(s) within the areas targeted by this assay, and inadequate number of viral copies (<131 copies/mL). A negative result must be combined with clinical observations, patient history, and epidemiological information. The expected result is Negative.  Fact Sheet for Patients:  https://www.moore.com/  Fact Sheet for Healthcare Providers:  https://www.young.biz/  This test is no t yet approved or cleared by the [place] FDA and  has been authorized for detection and/or diagnosis of SARS-CoV-2 by FDA under an Emergency Use Authorization (EUA). This EUA will remain  in effect (meaning this test can be used) for the duration of the COVID-19 declaration under Section 564(b)(1) of the Act, 21 U.S.C. section 360bbb-3(b)(1), unless the authorization is terminated or revoked sooner.     Influenza A by PCR NEGATIVE NEGATIVE Final   Influenza B by PCR NEGATIVE NEGATIVE Final    Comment: (NOTE) The Xpert Xpress SARS-CoV-2/FLU/RSV assay is intended as an aid in  the diagnosis of influenza from Nasopharyngeal swab specimens and  should not be used as a sole basis for treatment. Nasal washings and  aspirates are unacceptable for Xpert Xpress SARS-CoV-2/FLU/RSV  testing.  Fact Sheet for Patients: https://www.moore.com/  Fact Sheet for Healthcare Providers: https://www.young.biz/  This test is not yet approved or cleared by the [place] FDA and  has been authorized for detection and/or diagnosis of SARS-CoV-2 by  FDA under an Emergency Use Authorization (EUA). This EUA will  remain  in effect (meaning this test can be used) for the duration of the  Covid-19 declaration under Section 564(b)(1) of the Act, 21  U.S.C. section 360bbb-3(b)(1), unless the authorization is  terminated or revoked. Performed at [facility], [address] [place], [place] [address]   Blood culture (routine x 2)     Status: None (Preliminary result)   Collection Time: 03/29/20  8:48 PM   Specimen: BLOOD  Result Value Ref Range Status  Specimen Description BLOOD RIGHT HAND  Final   Special Requests   Final    BOTTLES DRAWN AEROBIC AND ANAEROBIC Blood Culture adequate volume   Culture   Final    NO GROWTH 2 DAYS Performed at [facility], [address] [place], [place] [address]    Report Status PENDING  Incomplete  MRSA PCR Screening     Status: None   Collection Time: 03/30/20  2:52 PM   Specimen: Nasal Mucosa; Nasopharyngeal  Result Value Ref Range Status   MRSA by PCR NEGATIVE NEGATIVE Final    Comment:        The GeneXpert MRSA Assay (FDA approved for NASAL specimens only), is one component of a comprehensive MRSA colonization surveillance program. It is not intended to diagnose MRSA infection nor to guide or monitor treatment for MRSA infections. Performed at [facility], [address] [place], [place] [address]      Time coordinating discharge: Over 30 minutes  SIGNED:   Tresa MooreSudheer B Noel Rodier, MD  [facility] 03/31/2020, 3:30 PM Pager   If 7PM-7AM, please contact night-coverage

## 2020-03-31 NOTE — Plan of Care (Signed)

## 2020-04-02 LAB — CULTURE, BLOOD (ROUTINE X 2): Culture: NO GROWTH

## 2020-04-03 LAB — CULTURE, BLOOD (ROUTINE X 2): Culture: NO GROWTH

## 2020-10-25 ENCOUNTER — Encounter: Payer: Self-pay | Admitting: *Deleted

## 2020-10-25 ENCOUNTER — Emergency Department (HOSPITAL_BASED_OUTPATIENT_CLINIC_OR_DEPARTMENT_OTHER)
Admission: EM | Admit: 2020-10-25 | Discharge: 2020-10-25 | Disposition: A | Discharge status: Home / Self Care | Payer: PRIVATE HEALTH INSURANCE | Attending: Emergency Medicine | Admitting: Emergency Medicine

## 2020-10-25 ENCOUNTER — Other Ambulatory Visit: Payer: Self-pay

## 2020-10-25 ENCOUNTER — Emergency Department (HOSPITAL_BASED_OUTPATIENT_CLINIC_OR_DEPARTMENT_OTHER): Payer: PRIVATE HEALTH INSURANCE | Admitting: Radiology

## 2020-10-25 ENCOUNTER — Ambulatory Visit
Admission: EM | Admit: 2020-10-25 | Discharge: 2020-10-25 | Disposition: A | Discharge status: Nursing Home (Includes ICF) | Payer: PRIVATE HEALTH INSURANCE

## 2020-10-25 ENCOUNTER — Encounter (HOSPITAL_BASED_OUTPATIENT_CLINIC_OR_DEPARTMENT_OTHER): Payer: Self-pay

## 2020-10-25 ENCOUNTER — Emergency Department (HOSPITAL_BASED_OUTPATIENT_CLINIC_OR_DEPARTMENT_OTHER): Payer: PRIVATE HEALTH INSURANCE

## 2020-10-25 DIAGNOSIS — Z794 Long term (current) use of insulin: Secondary | ICD-10-CM | POA: Insufficient documentation

## 2020-10-25 DIAGNOSIS — R0789 Other chest pain: Secondary | ICD-10-CM | POA: Diagnosis not present

## 2020-10-25 DIAGNOSIS — R1012 Left upper quadrant pain: Secondary | ICD-10-CM | POA: Diagnosis not present

## 2020-10-25 DIAGNOSIS — R0602 Shortness of breath: Secondary | ICD-10-CM | POA: Insufficient documentation

## 2020-10-25 DIAGNOSIS — I10 Essential (primary) hypertension: Secondary | ICD-10-CM | POA: Diagnosis not present

## 2020-10-25 DIAGNOSIS — R079 Chest pain, unspecified: Secondary | ICD-10-CM | POA: Diagnosis present

## 2020-10-25 DIAGNOSIS — W19XXXA Unspecified fall, initial encounter: Secondary | ICD-10-CM | POA: Diagnosis not present

## 2020-10-25 DIAGNOSIS — R109 Unspecified abdominal pain: Secondary | ICD-10-CM | POA: Diagnosis not present

## 2020-10-25 DIAGNOSIS — Z7984 Long term (current) use of oral hypoglycemic drugs: Secondary | ICD-10-CM | POA: Diagnosis not present

## 2020-10-25 DIAGNOSIS — Z79899 Other long term (current) drug therapy: Secondary | ICD-10-CM | POA: Diagnosis not present

## 2020-10-25 DIAGNOSIS — Y92096 Garden or yard of other non-institutional residence as the place of occurrence of the external cause: Secondary | ICD-10-CM | POA: Insufficient documentation

## 2020-10-25 DIAGNOSIS — Z87891 Personal history of nicotine dependence: Secondary | ICD-10-CM | POA: Insufficient documentation

## 2020-10-25 DIAGNOSIS — R0781 Pleurodynia: Secondary | ICD-10-CM

## 2020-10-25 DIAGNOSIS — E119 Type 2 diabetes mellitus without complications: Secondary | ICD-10-CM | POA: Insufficient documentation

## 2020-10-25 DIAGNOSIS — W01198A Fall on same level from slipping, tripping and stumbling with subsequent striking against other object, initial encounter: Secondary | ICD-10-CM | POA: Diagnosis not present

## 2020-10-25 MED ORDER — OXYCODONE-ACETAMINOPHEN 5-325 MG PO TABS
1.0000 | ORAL_TABLET | Freq: Once | ORAL | Status: AC
  Administered 2020-10-25: 12:00:00 1 via ORAL
  Filled 2020-10-25: qty 1

## 2020-10-25 MED ORDER — OXYCODONE-ACETAMINOPHEN 5-325 MG PO TABS
1.0000 | ORAL_TABLET | Freq: Four times a day (QID) | ORAL | 0 refills | Status: DC | PRN
Start: 2020-10-25 — End: 2020-12-28

## 2020-10-25 NOTE — Discharge Instructions (Addendum)
You were seen in the emergency department for pain in your left ribs after a fall.  You had a chest x-ray, x-rays of your left ribs, and a CAT scan of your abdomen that did not show any significant injury.  You likely have some bruised ribs.  Please do ibuprofen 3 times a day with food.  Ice to the affected area.  We are also prescribing some narcotic pain medicine for use for severe pain.  Follow-up with your doctor.  Return to the emergency department any worsening or concerning symptoms.

## 2020-10-25 NOTE — ED Provider Notes (Signed)
[name]    CSN: [id number] Arrival date & time: 10/25/20  0836      History   Chief Complaint Chief Complaint  Patient presents with   Abdominal Pain   Rib Injury    HPI Debra Andrews is a 50 y.o. female presenting with abdominal and rib pain following fall that occurred 2 days ago. Medical history diabetes, hypertension, hyperlipidemia, sepsis, SVT.  Patient states that a family member's dog jumped on her, causing her to lose balance and fall with her left side hitting a lawnmower handle.  Since then, she has had severe left lateral rib pain and left upper quadrant pain.  Denies shortness of breath, but states that the pain sometimes stops her from taking a deep breath.  Denies dizziness before or after the fall.  Denies head trauma.  Denies chest pain.  Denies change in bowel or bladder function, though it hurts to strain and have a bowel movement.  Denies hematuria. Symptoms poorly controlled on tylenol/ibuprofen.  HPI  Past Medical History:  Diagnosis Date   Arrhythmia    Diabetes mellitus without complication (HCC)    Hyperlipidemia    Hypertension     Patient Active Problem List   Diagnosis Date Noted   Essential hypertension 03/29/2020   Preseptal cellulitis of right eye 03/29/2020   Sepsis (HCC) 03/29/2020   Paroxysmal supraventricular tachycardia (HCC) 12/26/2019   Type 2 diabetes mellitus without complication, with long-term current use of insulin (HCC) 08/15/2019   Anxiety disorder 08/15/2019    Past Surgical History:  Procedure Laterality Date   LAPAROSCOPIC ENDOMETRIOSIS FULGURATION      OB History   No obstetric history on file.      Home Medications    Prior to Admission medications   Medication Sig Start Date End Date Taking? Authorizing Provider  acetaminophen (TYLENOL) 325 MG tablet Take 2 tablets (650 mg total) by mouth every 6 (six) hours as needed for mild pain or moderate pain (or Fever >/= 101). 03/31/20  Yes Sreenath,  Sudheer B, MD  DULoxetine (CYMBALTA) 60 MG capsule Take 60 mg by mouth daily. 02/21/20  Yes [provider]  flecainide (TAMBOCOR) 50 MG tablet Take 50 mg by mouth 2 (two) times daily. 11/21/19  Yes [provider]  glyBURIDE (DIABETA) 1.25 MG tablet Take 1.25 mg by mouth 2 (two) times daily. 02/21/20  Yes [provider]  insulin glargine (LANTUS) 100 UNIT/ML injection Inject 12 Units into the skin. 07/31/19  Yes [provider]  lisinopril (ZESTRIL) 10 MG tablet Take 10 mg by mouth daily. 08/12/17  Yes [provider]  meloxicam (MOBIC) 15 MG tablet Take 15 mg by mouth daily. 08/12/17  Yes [provider]  metFORMIN (GLUCOPHAGE-XR) 750 MG 24 hr tablet Take 750 mg by mouth 2 (two) times daily. 08/12/17  Yes [provider]  metoprolol tartrate (LOPRESSOR) 25 MG tablet Take 25 mg by mouth 2 (two) times daily. 02/21/20  Yes [provider]  Alcohol Swabs (ALCOHOL WIPES) 70 % PADS Apply topically. 07/31/19   [provider]  glucose blood (PRECISION QID TEST) test strip Check 3 times daily 07/31/19   [provider]  HYDROcodone-acetaminophen (NORCO/VICODIN) 5-325 MG tablet Take 1-2 tablets by mouth every 4 (four) hours as needed for severe pain. 03/31/20   Tresa Moore, MD  ibuprofen (ADVIL) 400 MG tablet Take 1 tablet (400 mg total) by mouth every 8 (eight) hours as needed for mild pain or moderate pain. 03/31/20  Lolita Patella B, MD  Insulin Syringe-Needle U-100 (BD VEO INSULIN SYR U/F 1/2UNIT) 31G X 15/64" 0.3 ML MISC Inject into the skin. 07/31/19   [provider]    Family History History reviewed. No pertinent family history.  Social History Social History   Tobacco Use   Smoking status: Former    Pack years: 0.00    Types: Cigarettes   Smokeless tobacco: Never  Substance Use Topics   Alcohol use: Yes    Comment: occasionally   Drug use: Never     Allergies    Penicillins   Review of Systems Review of Systems  Constitutional:  Negative for appetite change, chills, diaphoresis, fever and unexpected weight change.  HENT:  Negative for congestion, ear pain, sinus pressure, sinus pain, sneezing, sore throat and trouble swallowing.   Respiratory:  Negative for cough, chest tightness and shortness of breath.   Cardiovascular:  Negative for chest pain.  Gastrointestinal:  Positive for abdominal pain. Negative for abdominal distention, anal bleeding, blood in stool, constipation, diarrhea, nausea, rectal pain and vomiting.  Genitourinary:  Negative for decreased urine volume, dysuria, flank pain, frequency, genital sores, hematuria, menstrual problem, pelvic pain, urgency, vaginal bleeding, vaginal discharge and vaginal pain.  Musculoskeletal:  Negative for back pain and myalgias.       L lateral rib pain  Neurological:  Negative for dizziness, light-headedness and headaches.  All other systems reviewed and are negative.   Physical Exam Triage Vital Signs ED Triage Vitals  Enc Vitals Group     BP 10/25/20 0933 (!) 144/91     Pulse Rate 10/25/20 0933 (!) 113     Resp 10/25/20 0933 18     Temp 10/25/20 0933 98.3 F (36.8 C)     Temp Source 10/25/20 0933 Oral     SpO2 10/25/20 0933 98 %     Weight --      Height --      Head Circumference --      Peak Flow --      Pain Score 10/25/20 0939 8     Pain Loc --      Pain Edu? --      Excl. in GC? --    No data found.  Updated Vital Signs BP (!) 144/91 (BP Location: Left Arm)   Pulse (!) 113   Temp 98.3 F (36.8 C) (Oral)   Resp 18   SpO2 98%   Visual Acuity Right Eye Distance:   Left Eye Distance:   Bilateral Distance:    Right Eye Near:   Left Eye Near:    Bilateral Near:     Physical Exam Vitals reviewed.  Constitutional:      General: She is not in acute distress.    Appearance: Normal appearance. She is not ill-appearing.  HENT:     Head: Normocephalic and atraumatic.      Mouth/Throat:     Mouth: Mucous membranes are moist.     Comments: Moist mucous membranes Eyes:     Extraocular Movements: Extraocular movements intact.     Pupils: Pupils are equal, round, and reactive to light.  Cardiovascular:     Rate and Rhythm: Normal rate and regular rhythm.     Heart sounds: Normal heart sounds.  Pulmonary:     Effort: Pulmonary effort is normal.     Breath sounds: Normal breath sounds. No wheezing, rhonchi or rales.  Abdominal:     General: Bowel sounds are normal. There is no distension.  Palpations: Abdomen is soft. There is no mass.     Tenderness: There is abdominal tenderness in the left upper quadrant. There is no right CVA tenderness, left CVA tenderness, guarding or rebound. Negative signs include Murphy's sign, Rovsing's sign and McBurney's sign.     Comments: LUQ exquisitely tender to palpation   Musculoskeletal:     Comments: L lateral ribs diffusely TTP, without ecchymosis or obvious bony deformity.  No spinous deformity or stepoff. No pelvic or hip instability.  Skin:    General: Skin is warm.     Capillary Refill: Capillary refill takes less than 2 seconds.     Comments: Good skin turgor  Neurological:     General: No focal deficit present.     Mental Status: She is alert and oriented to person, place, and time.  Psychiatric:        Mood and Affect: Mood normal.        Behavior: Behavior normal.     UC Treatments / Results  Labs (all labs ordered are listed, but only abnormal results are displayed) Labs Reviewed - No data to display  EKG   Radiology No results found.  Procedures Procedures (including critical care time)  Medications Ordered in UC Medications - No data to display  Initial Impression / Assessment and Plan / UC Course  I have reviewed the triage vital signs and the nursing notes.  Pertinent labs & imaging results that were available during my care of the patient were reviewed by me and considered in my  medical decision making (see chart for details).     This patient is a 50 year old female presenting with significant left upper quadrant and left lateral rib pain following fall that occurred 2 days ago.  She is tachycardic but afebrile.  Based on exam, I am concerned for injury of kidney or spleen.  I am recommending this patient head straight to the emergency department for further evaluation including abdominal imaging.  She verbalizes understanding and agreement.  She is hemodynamically stable for transport in personal vehicle at this time.  Final Clinical Impressions(s) / UC Diagnoses   Final diagnoses:  LUQ pain  Fall, initial encounter  Rib pain on left side     Discharge Instructions      -Head straight to ED for further evaluation and management of your abdominal and rib pain. I'm concerned you have an injury to an internal organ, based on your symptoms and how you fell. This needs to be evaluated and managed in the ED setting. -You can head to any emergency department, but our [facility] facility is new and typically has a much shorter wait.  Information and address below. -If you develop new symptoms while transporting yourself like shortness of breath, chest pain, dizziness, worsening abdominal pain-stop and call 911 immediately.     ED Prescriptions   None    PDMP not reviewed this encounter.   Rhys Martini, PA-C 10/25/20 [phone]

## 2020-10-25 NOTE — Discharge Instructions (Addendum)
-  Head straight to ED for further evaluation and management of your abdominal and rib pain. I'm concerned you have an injury to an internal organ, based on your symptoms and how you fell. This needs to be evaluated and managed in the ED setting. -You can head to any emergency department, but our [facility] facility is new and typically has a much shorter wait.  Information and address below. -If you develop new symptoms while transporting yourself like shortness of breath, chest pain, dizziness, worsening abdominal pain-stop and call 911 immediately.

## 2020-10-25 NOTE — ED Triage Notes (Signed)
Pt reports a dog jumped up on her on Saturday, causing her to fall back on her lawn mower.  She reports left side rib cage pain with occasional shortness of breath.

## 2020-10-25 NOTE — ED Notes (Signed)
Patient transported to X-ray 

## 2020-10-25 NOTE — ED Notes (Signed)
Patient is being discharged from the Urgent Care Center and sent to the Emergency Department via private vehicle. Per L. Cheree Ditto, [place], patient is stable but in need of higher level of care due to R/O splenic or kidney injury. Patient is aware and verbalizes understanding of plan of care.  Vitals:   10/25/20 0933  BP: (!) 144/91  Pulse: (!) 113  Resp: 18  Temp: 98.3 F (36.8 C)  SpO2: 98%

## 2020-10-25 NOTE — ED Provider Notes (Signed)
[facility] EMERGENCY DEPT Provider Note   CSN: [id number] Arrival date & time: 10/25/20  1103     History No chief complaint on file.   Debra Andrews is a 50 y.o. female.  She has a history of diabetes, not on anticoagulation.  She was in the yard when a dog jumped on her and she fell back striking her ribs on a lawnmower.  This was 2 days ago.  She continues to have rib pain worse with deep breath and twisting and turning.  She is tried Tylenol without improvement.  No hemoptysis.  No vomiting or diarrhea.  The history is provided by the patient.  Fall This is a new problem. The current episode started 2 days ago. The problem occurs constantly. The problem has not changed since onset.Associated symptoms include chest pain, abdominal pain and shortness of breath. Pertinent negatives include no headaches. The symptoms are aggravated by bending, twisting and coughing. Nothing relieves the symptoms. She has tried acetaminophen for the symptoms. The treatment provided no relief.      Past Medical History:  Diagnosis Date   Arrhythmia    Diabetes mellitus without complication (HCC)    Hyperlipidemia    Hypertension     Patient Active Problem List   Diagnosis Date Noted   Essential hypertension 03/29/2020   Preseptal cellulitis of right eye 03/29/2020   Sepsis (HCC) 03/29/2020   Paroxysmal supraventricular tachycardia (HCC) 12/26/2019   Type 2 diabetes mellitus without complication, with long-term current use of insulin (HCC) 08/15/2019   Anxiety disorder 08/15/2019    Past Surgical History:  Procedure Laterality Date   LAPAROSCOPIC ENDOMETRIOSIS FULGURATION       OB History   No obstetric history on file.     No family history on file.  Social History   Tobacco Use   Smoking status: Former    Pack years: 0.00    Types: Cigarettes   Smokeless tobacco: Never  Substance Use Topics   Alcohol use: Yes    Comment: occasionally   Drug use: Never     Home Medications Prior to Admission medications   Medication Sig Start Date End Date Taking? Authorizing Provider  acetaminophen (TYLENOL) 325 MG tablet Take 2 tablets (650 mg total) by mouth every 6 (six) hours as needed for mild pain or moderate pain (or Fever >/= 101). 03/31/20  Yes Sreenath, Sudheer B, MD  Alcohol Swabs (ALCOHOL WIPES) 70 % PADS Apply topically. 07/31/19  Yes [provider]  DULoxetine (CYMBALTA) 60 MG capsule Take 60 mg by mouth daily. 02/21/20  Yes [provider]  flecainide (TAMBOCOR) 50 MG tablet Take 50 mg by mouth 2 (two) times daily. 11/21/19  Yes [provider]  glucose blood (PRECISION QID TEST) test strip Check 3 times daily 07/31/19  Yes [provider]  glyBURIDE (DIABETA) 1.25 MG tablet Take 1.25 mg by mouth 2 (two) times daily. 02/21/20  Yes [provider]  insulin glargine (LANTUS) 100 UNIT/ML injection Inject 12 Units into the skin. 07/31/19  Yes [provider]  Insulin Syringe-Needle U-100 (BD VEO INSULIN SYR U/F 1/2UNIT) 31G X 15/64" 0.3 ML MISC Inject into the skin. 07/31/19  Yes [provider]  lisinopril (ZESTRIL) 10 MG tablet Take 10 mg by mouth daily. 08/12/17  Yes [provider]  meloxicam (MOBIC) 15 MG tablet Take 15 mg by mouth daily. 08/12/17  Yes [provider]  metFORMIN (GLUCOPHAGE-XR) 750 MG 24 hr tablet Take 750 mg by mouth 2 (two)  times daily. 08/12/17  Yes [provider]  metoprolol tartrate (LOPRESSOR) 25 MG tablet Take 25 mg by mouth 2 (two) times daily. 02/21/20  Yes [provider]  HYDROcodone-acetaminophen (NORCO/VICODIN) 5-325 MG tablet Take 1-2 tablets by mouth every 4 (four) hours as needed for severe pain. 03/31/20   Tresa Moore, MD  ibuprofen (ADVIL) 400 MG tablet Take 1 tablet (400 mg total) by mouth every 8 (eight) hours as needed for mild pain or moderate pain. 03/31/20   Tresa Moore, MD    Allergies     Penicillins  Review of Systems   Review of Systems  Constitutional:  Negative for fever.  HENT:  Negative for sore throat.   Eyes:  Negative for visual disturbance.  Respiratory:  Positive for shortness of breath.   Cardiovascular:  Positive for chest pain.  Gastrointestinal:  Positive for abdominal pain. Negative for nausea and vomiting.  Genitourinary:  Negative for dysuria.  Musculoskeletal:  Negative for neck pain.  Skin:  Negative for rash.  Neurological:  Negative for headaches.   Physical Exam Updated Vital Signs BP (!) 137/91 (BP Location: Right Arm)   Pulse 100   Temp 98.9 F (37.2 C) (Oral)   Resp 20   Ht 4\' 11"  (1.499 m)   Wt 56.2 kg   SpO2 99%   BMI 25.04 kg/m   Physical Exam Vitals and nursing note reviewed.  Constitutional:      General: She is not in acute distress.    Appearance: Normal appearance. She is well-developed.  HENT:     Head: Normocephalic and atraumatic.  Eyes:     Conjunctiva/sclera: Conjunctivae normal.  Cardiovascular:     Rate and Rhythm: Normal rate and regular rhythm.     Pulses: Normal pulses.     Heart sounds: No murmur heard. Pulmonary:     Effort: Pulmonary effort is normal. No respiratory distress.     Breath sounds: Normal breath sounds.  Chest:     Chest wall: Tenderness (left lower anterior and lateral ribs, no crepitus) present.  Abdominal:     Palpations: Abdomen is soft.     Tenderness: There is no abdominal tenderness.  Musculoskeletal:        General: No deformity or signs of injury. Normal range of motion.     Cervical back: Neck supple.  Skin:    General: Skin is warm and dry.  Neurological:     General: No focal deficit present.     Mental Status: She is alert.    ED Results / Procedures / Treatments   Labs (all labs ordered are listed, but only abnormal results are displayed) Labs Reviewed - No data to display  EKG None  Radiology CT Abdomen Pelvis Wo Contrast  Result Date: 10/25/2020 CLINICAL  DATA:  Abdominal trauma, rib pain, flank pain, abdominal pain, fell 2 days ago. Past medical history includes diabetes mellitus, hypertension, hyperlipidemia EXAM: CT ABDOMEN AND PELVIS WITHOUT CONTRAST TECHNIQUE: Multidetector CT imaging of the abdomen and pelvis was performed following the standard protocol without IV contrast. Sagittal and coronal MPR images reconstructed from axial data set. No oral contrast. COMPARISON:  None FINDINGS: Lower chest: Lung bases clear Hepatobiliary: Gallbladder and liver normal appearance Pancreas: Normal appearance Spleen: Normal appearance Adrenals/Urinary Tract: Adrenal glands, kidneys, ureters, and bladder normal appearance Stomach/Bowel: Normal appendix. Stomach and bowel loops unremarkable. Vascular/Lymphatic: Aorta normal caliber with minimal atherosclerotic calcification. No adenopathy. Reproductive: Unremarkable uterus and adnexa Other: No free air or free fluid.  No hernia or acute inflammatory process. Musculoskeletal: No fractures identified. IMPRESSION: No acute intra-abdominal or intrapelvic abnormalities. Aortic Atherosclerosis (ICD10-I70.0). Electronically Signed   By: Ulyses Southward M.D.   On: 10/25/2020 13:06   DG Ribs Unilateral W/Chest Left  Result Date: 10/25/2020 CLINICAL DATA:  Fall.  Left chest pain EXAM: LEFT RIBS AND CHEST - 3+ VIEW COMPARISON:  07/30/2019 FINDINGS: No fracture or other bone lesions are seen involving the ribs. There is no evidence of pneumothorax or pleural effusion. Both lungs are clear. Heart size and mediastinal contours are within normal limits. IMPRESSION: Negative. Electronically Signed   By: Marlan Palau M.D.   On: 10/25/2020 12:26    Procedures Procedures   Medications Ordered in ED Medications  oxyCODONE-acetaminophen (PERCOCET/ROXICET) 5-325 MG per tablet 1 tablet (1 tablet Oral Given 10/25/20 1221)    ED Course  I have reviewed the triage vital signs and the nursing notes.  Pertinent labs & imaging results that  were available during my care of the patient were reviewed by me and considered in my medical decision making (see chart for details).    MDM Rules/Calculators/A&P                         This patient complains of left-sided chest flank and abdominal pain after a fall; this involves an extensive number of treatment Options and is a complaint that carries with it a high risk of complications and Morbidity. The differential includes rib fractures, contusion, renal injury, splenic lack, pneumothorax  I ordered medication oral pain medication with some improvement in the pain I ordered imaging studies which included chest x-ray and rib series, CT abdomen and pelvis without contrast due to lack of IV contrast and I independently    visualized and interpreted imaging which showed no acute findings Previous records obtained and reviewed in epic including prior urgent care visit which was concern for splenic injury  After the interventions stated above, I reevaluated the patient and found patient's pain to be somewhat improved.  Reviewed work-up of imaging with her.  Will provide short prescription of some pain medication.  Recommended close follow-up with PCP and return instructions discussed   Final Clinical Impression(s) / ED Diagnoses Final diagnoses:  Acute chest wall pain  Fall, initial encounter    Rx / DC Orders ED Discharge Orders          Ordered    oxyCODONE-acetaminophen (PERCOCET/ROXICET) 5-325 MG tablet  Every 6 hours PRN        10/25/20 1325             Terrilee Files, MD 10/25/20 1726

## 2020-10-25 NOTE — ED Triage Notes (Signed)
Pt reports a family member's dog jumped up on her 2 days ago, causing her to lose balance and fall with left side hitting a lawnmower handle.  C/O left upper abd and left distal rib pain radiating around left flank.  Pt has tenderness to light palpation of left upper abd and flank area.  C/O painful movements and increased pain with coughing or deep breathing.  Denies dizziness or hematuria.

## 2020-12-24 ENCOUNTER — Ambulatory Visit
Admission: EM | Admit: 2020-12-24 | Discharge: 2020-12-24 | Disposition: A | Discharge status: Home / Self Care | Payer: No Typology Code available for payment source | Attending: Internal Medicine | Admitting: Internal Medicine

## 2020-12-24 ENCOUNTER — Other Ambulatory Visit: Payer: Self-pay

## 2020-12-24 DIAGNOSIS — L02212 Cutaneous abscess of back [any part, except buttock]: Secondary | ICD-10-CM

## 2020-12-24 MED ORDER — DOXYCYCLINE HYCLATE 100 MG PO CAPS: 100.0000 mg | ORAL_CAPSULE | Freq: Two times a day (BID) | ORAL | 0 refills | Status: DC

## 2020-12-24 NOTE — ED Provider Notes (Signed)
[name]    CSN: [id number] Arrival date & time: 12/24/20  0956      History   Chief Complaint Chief Complaint  Patient presents with   back abscess    HPI Debra Andrews is a 50 y.o. female.   Patient presents with abscess that has been present to left upper back for approximately 2 days.  Abscess is painful and burning.  Denies any known fevers at home.  Abscess has been draining on its own which started yesterday.  Patient has used cool and warm compresses with no improvement.    Past Medical History:  Diagnosis Date   Arrhythmia    Diabetes mellitus without complication (HCC)    Hyperlipidemia    Hypertension     Patient Active Problem List   Diagnosis Date Noted   Essential hypertension 03/29/2020   Preseptal cellulitis of right eye 03/29/2020   Sepsis (HCC) 03/29/2020   Paroxysmal supraventricular tachycardia (HCC) 12/26/2019   Type 2 diabetes mellitus without complication, with long-term current use of insulin (HCC) 08/15/2019   Anxiety disorder 08/15/2019    Past Surgical History:  Procedure Laterality Date   LAPAROSCOPIC ENDOMETRIOSIS FULGURATION      OB History   No obstetric history on file.      Home Medications    Prior to Admission medications   Medication Sig Start Date End Date Taking? Authorizing Provider  doxycycline (VIBRAMYCIN) 100 MG capsule Take 1 capsule (100 mg total) by mouth 2 (two) times daily. 12/24/20  Yes Lance Muss, FNP  acetaminophen (TYLENOL) 325 MG tablet Take 2 tablets (650 mg total) by mouth every 6 (six) hours as needed for mild pain or moderate pain (or Fever >/= 101). 03/31/20   Tresa Moore, MD  Alcohol Swabs (ALCOHOL WIPES) 70 % PADS Apply topically. 07/31/19   [provider]  DULoxetine (CYMBALTA) 60 MG capsule Take 60 mg by mouth daily. 02/21/20   [provider]  flecainide (TAMBOCOR) 50 MG tablet Take 50 mg by mouth 2 (two) times daily. 11/21/19   [provider]   glucose blood (PRECISION QID TEST) test strip Check 3 times daily 07/31/19   [provider]  glyBURIDE (DIABETA) 1.25 MG tablet Take 1.25 mg by mouth 2 (two) times daily. 02/21/20   [provider]  HYDROcodone-acetaminophen (NORCO/VICODIN) 5-325 MG tablet Take 1-2 tablets by mouth every 4 (four) hours as needed for severe pain. 03/31/20   Tresa Moore, MD  ibuprofen (ADVIL) 400 MG tablet Take 1 tablet (400 mg total) by mouth every 8 (eight) hours as needed for mild pain or moderate pain. 03/31/20   Sreenath, Sudheer B, MD  insulin glargine (LANTUS) 100 UNIT/ML injection Inject 12 Units into the skin. 07/31/19   [provider]  Insulin Syringe-Needle U-100 (BD VEO INSULIN SYR U/F 1/2UNIT) 31G X 15/64" 0.3 ML MISC Inject into the skin. 07/31/19   [provider]  lisinopril (ZESTRIL) 10 MG tablet Take 10 mg by mouth daily. 08/12/17   [provider]  meloxicam (MOBIC) 15 MG tablet Take 15 mg by mouth daily. 08/12/17   [provider]  metFORMIN (GLUCOPHAGE-XR) 750 MG 24 hr tablet Take 750 mg by mouth 2 (two) times daily. 08/12/17   [provider]  metoprolol tartrate (LOPRESSOR) 25 MG tablet Take 25 mg by mouth 2 (two) times daily. 02/21/20   [provider]  oxyCODONE-acetaminophen (PERCOCET/ROXICET) 5-325 MG tablet Take 1 tablet by mouth every 6 (six) hours as needed  for severe pain. 10/25/20   Terrilee Files, MD    Family History History reviewed. No pertinent family history.  Social History Social History   Tobacco Use   Smoking status: Former    Types: Cigarettes   Smokeless tobacco: Never  Substance Use Topics   Alcohol use: Yes    Comment: occasionally   Drug use: Never     Allergies   Penicillins   Review of Systems Review of Systems Per HPI  Physical Exam Triage Vital Signs ED Triage Vitals  Enc Vitals Group     BP 12/24/20 1005 129/76     Pulse Rate 12/24/20 1005 (!) 123     Resp  12/24/20 1005 18     Temp 12/24/20 1005 99.6 F (37.6 C)     Temp Source 12/24/20 1005 Oral     SpO2 12/24/20 1005 97 %     Weight --      Height --      Head Circumference --      Peak Flow --      Pain Score 12/24/20 1006 8     Pain Loc --      Pain Edu? --      Excl. in GC? --    No data found.  Updated Vital Signs BP 129/76 (BP Location: Left Arm)   Pulse (!) 123   Temp 99.6 F (37.6 C) (Oral)   Resp 18   SpO2 97%   Visual Acuity Right Eye Distance:   Left Eye Distance:   Bilateral Distance:    Right Eye Near:   Left Eye Near:    Bilateral Near:     Physical Exam Constitutional:      Appearance: Normal appearance.  HENT:     Head: Normocephalic and atraumatic.  Eyes:     Extraocular Movements: Extraocular movements intact.     Conjunctiva/sclera: Conjunctivae normal.  Cardiovascular:     Rate and Rhythm: Regular rhythm. Tachycardia present.     Pulses: Normal pulses.     Heart sounds: Normal heart sounds.  Pulmonary:     Effort: Pulmonary effort is normal.     Breath sounds: Normal breath sounds.  Skin:    General: Skin is warm and dry.     Findings: Abscess present.     Comments: Approximately 2 inch diameter hard, indurated, erythematous abscess present to left upper back.  Abscess is draining purulent discharge on physical exam.  Neurological:     General: No focal deficit present.     Mental Status: She is alert and oriented to person, place, and time. Mental status is at baseline.  Psychiatric:        Mood and Affect: Mood normal.        Behavior: Behavior normal.        Thought Content: Thought content normal.        Judgment: Judgment normal.     UC Treatments / Results  Labs (all labs ordered are listed, but only abnormal results are displayed) Labs Reviewed - No data to display  EKG   Radiology No results found.  Procedures Procedures (including critical care time)  Medications Ordered in UC Medications - No data to  display  Initial Impression / Assessment and Plan / UC Course  I have reviewed the triage vital signs and the nursing notes.  Pertinent labs & imaging results that were available during my care of the patient were reviewed by me and considered in my medical decision  making (see chart for details).     Abscess is too hard for I&D at this time.  Discussed this with patient.  Doxycycline x10 days prescribed to treat abscess.  Advised patient to use warm compresses to soften abscess as well.  Patient to follow-up in 2 to 3 days if abscess shows no improvement.  Fever monitoring and management discussed with patient as well.  Patient states that heart rate is normally elevated as well. Discussed strict return precautions.  Low suspicion for sepsis related to abscess due to patient's heart rate normally being elevated.  Other vital signs stable at this time.  Advised patient to go to the hospital if symptoms or abscess significantly worsen.  Patient verbalized understanding and is agreeable with plan.  Final Clinical Impressions(s) / UC Diagnoses   Final diagnoses:  Cutaneous abscess of back (any part, except buttock)     Discharge Instructions      You been prescribed doxycycline antibiotic to treat abscess of back.  Please use warm compresses to soften up abscess and help with drainage.  Please follow-up if no improvement in abscess in 2 to 3 days.  Monitor fevers as well.     ED Prescriptions     Medication Sig Dispense Auth. Provider   doxycycline (VIBRAMYCIN) 100 MG capsule Take 1 capsule (100 mg total) by mouth 2 (two) times daily. 20 capsule Lance Muss, FNP      PDMP not reviewed this encounter.   Lance Muss, FNP 12/24/20 1115

## 2020-12-24 NOTE — ED Triage Notes (Signed)
Pt c/o "boil" on upper left back that has been draining, onset x2 days ago. States it is 8/10 throbbing, burning, painful sensation that "feels like something is crawling in it." States this has never happened before.

## 2020-12-24 NOTE — Discharge Instructions (Addendum)
You been prescribed doxycycline antibiotic to treat abscess of back.  Please use warm compresses to soften up abscess and help with drainage.  Please follow-up if no improvement in abscess in 2 to 3 days.  Monitor fevers as well.

## 2020-12-26 ENCOUNTER — Encounter: Payer: Self-pay | Admitting: Emergency Medicine

## 2020-12-26 ENCOUNTER — Ambulatory Visit
Admission: EM | Admit: 2020-12-26 | Discharge: 2020-12-26 | Disposition: A | Discharge status: Home / Self Care | Payer: No Typology Code available for payment source | Attending: Internal Medicine | Admitting: Internal Medicine

## 2020-12-26 DIAGNOSIS — L02212 Cutaneous abscess of back [any part, except buttock]: Secondary | ICD-10-CM | POA: Diagnosis not present

## 2020-12-26 NOTE — ED Triage Notes (Signed)
Patient c/o left shoulder abscess with no improvement.  Patient has been on antibiotics x 2 days w/o much improvement.  Area is still red,inflamed,painful.

## 2020-12-26 NOTE — ED Provider Notes (Signed)
[name]    CSN: [id number] Arrival date & time: 12/26/20  0836      History   Chief Complaint Chief Complaint  Patient presents with   Abscess    HPI Debra Andrews is a 50 y.o. female.   Patient presents for follow-up and further evaluation of abscess to left upper back.  Patient was seen approximately 2 days ago at urgent care and was prescribed doxycycline antibiotic.  Patient has been taking antibiotic as prescribed.  Abscess was not able to be drained at that time due to appearance of abscess on physical exam.  Patient was advised to use warm compresses as well, which she has been doing..  Patient states that abscess has not improved since last visit.  States that she has had one low-grade fever of 100.4.   Abscess  Past Medical History:  Diagnosis Date   Arrhythmia    Diabetes mellitus without complication (HCC)    Hyperlipidemia    Hypertension     Patient Active Problem List   Diagnosis Date Noted   Essential hypertension 03/29/2020   Preseptal cellulitis of right eye 03/29/2020   Sepsis (HCC) 03/29/2020   Paroxysmal supraventricular tachycardia (HCC) 12/26/2019   Type 2 diabetes mellitus without complication, with long-term current use of insulin (HCC) 08/15/2019   Anxiety disorder 08/15/2019    Past Surgical History:  Procedure Laterality Date   LAPAROSCOPIC ENDOMETRIOSIS FULGURATION      OB History   No obstetric history on file.      Home Medications    Prior to Admission medications   Medication Sig Start Date End Date Taking? Authorizing Provider  acetaminophen (TYLENOL) 325 MG tablet Take 2 tablets (650 mg total) by mouth every 6 (six) hours as needed for mild pain or moderate pain (or Fever >/= 101). 03/31/20  Yes Sreenath, Sudheer B, MD  Alcohol Swabs (ALCOHOL WIPES) 70 % PADS Apply topically. 07/31/19  Yes [provider]  doxycycline (VIBRAMYCIN) 100 MG capsule Take 1 capsule (100 mg total) by mouth 2 (two) times  daily. 12/24/20  Yes Lance Muss, FNP  DULoxetine (CYMBALTA) 60 MG capsule Take 60 mg by mouth daily. 02/21/20  Yes [provider]  flecainide (TAMBOCOR) 50 MG tablet Take 50 mg by mouth 2 (two) times daily. 11/21/19  Yes [provider]  glucose blood (PRECISION QID TEST) test strip Check 3 times daily 07/31/19  Yes [provider]  glyBURIDE (DIABETA) 1.25 MG tablet Take 1.25 mg by mouth 2 (two) times daily. 02/21/20  Yes [provider]  HYDROcodone-acetaminophen (NORCO/VICODIN) 5-325 MG tablet Take 1-2 tablets by mouth every 4 (four) hours as needed for severe pain. 03/31/20  Yes Sreenath, Sudheer B, MD  ibuprofen (ADVIL) 400 MG tablet Take 1 tablet (400 mg total) by mouth every 8 (eight) hours as needed for mild pain or moderate pain. 03/31/20  Yes Sreenath, Sudheer B, MD  insulin glargine (LANTUS) 100 UNIT/ML injection Inject 12 Units into the skin. 07/31/19  Yes [provider]  Insulin Syringe-Needle U-100 (BD VEO INSULIN SYR U/F 1/2UNIT) 31G X 15/64" 0.3 ML MISC Inject into the skin. 07/31/19  Yes [provider]  lisinopril (ZESTRIL) 10 MG tablet Take 10 mg by mouth daily. 08/12/17  Yes [provider]  meloxicam (MOBIC) 15 MG tablet Take 15 mg by mouth daily. 08/12/17  Yes [provider]  metFORMIN (GLUCOPHAGE-XR) 750 MG 24 hr tablet Take 750 mg by mouth 2 (two) times daily. 08/12/17  Yes  [provider]  metoprolol tartrate (LOPRESSOR) 25 MG tablet Take 25 mg by mouth 2 (two) times daily. 02/21/20  Yes [provider]  oxyCODONE-acetaminophen (PERCOCET/ROXICET) 5-325 MG tablet Take 1 tablet by mouth every 6 (six) hours as needed for severe pain. 10/25/20  Yes Terrilee Files, MD    Family History No family history on file.  Social History Social History   Tobacco Use   Smoking status: Former    Types: Cigarettes   Smokeless tobacco: Never  Substance Use Topics   Alcohol use: Yes    Comment:  occasionally   Drug use: Never     Allergies   Penicillins   Review of Systems Review of Systems Per HPI  Physical Exam Triage Vital Signs ED Triage Vitals  Enc Vitals Group     BP      Pulse      Resp      Temp      Temp src      SpO2      Weight      Height      Head Circumference      Peak Flow      Pain Score      Pain Loc      Pain Edu?      Excl. in GC?    No data found.  Updated Vital Signs BP (!) 134/94 (BP Location: Left Arm)   Pulse (!) 110   Temp 97.6 F (36.4 C) (Oral)   Resp 18   Ht 4\' 11"  (1.499 m)   Wt 114 lb (51.7 kg)   SpO2 99%   BMI 23.03 kg/m   Visual Acuity Right Eye Distance:   Left Eye Distance:   Bilateral Distance:    Right Eye Near:   Left Eye Near:    Bilateral Near:     Physical Exam Constitutional:      Appearance: Normal appearance.  HENT:     Head: Normocephalic and atraumatic.  Eyes:     Extraocular Movements: Extraocular movements intact.     Conjunctiva/sclera: Conjunctivae normal.  Cardiovascular:     Rate and Rhythm: Normal rate and regular rhythm.     Pulses: Normal pulses.     Heart sounds: Normal heart sounds.  Pulmonary:     Effort: Pulmonary effort is normal.  Lymphadenopathy:     Cervical: No cervical adenopathy.     Comments: No lymph node involvement at this time.  Skin:    General: Skin is warm and dry.     Findings: Abscess present.     Comments: Abscess located to left upper back is slightly larger than last visit.  Abscess is currently draining purulent drainage on initial physical exam.  Right upper portion of abscess is fluctuant, although left lower portion is still slightly indurated.  Neurological:     General: No focal deficit present.     Mental Status: She is alert and oriented to person, place, and time. Mental status is at baseline.  Psychiatric:        Mood and Affect: Mood normal.        Behavior: Behavior normal.        Thought Content: Thought content normal.         Judgment: Judgment normal.     UC Treatments / Results  Labs (all labs ordered are listed, but only abnormal results are displayed) Labs Reviewed  AEROBIC CULTURE W GRAM STAIN (SUPERFICIAL SPECIMEN)    EKG  Radiology No results found.  Procedures Incision and Drainage  Date/Time: 12/26/2020 9:51 AM Performed by: Lance Muss, FNP Authorized by: Lance Muss, FNP   Consent:    Consent obtained:  Verbal   Consent given by:  Patient   Risks discussed:  Bleeding, incomplete drainage, pain and damage to other organs   Alternatives discussed:  No treatment Universal protocol:    Procedure explained and questions answered to patient or proxy's satisfaction: yes     Site/side marked: yes     Immediately prior to procedure, a time out was called: yes     Patient identity confirmed:  Verbally with patient and arm band Location:    Type:  Abscess   Size:  2-3 inches   Location: Left upper back. Pre-procedure details:    Skin preparation:  Betadine Anesthesia:    Anesthesia method:  Local infiltration   Local anesthetic:  Lidocaine 1% w/o epi Procedure type:    Complexity:  Complex Procedure details:    Incision types:  Single straight   Incision depth:  Subcutaneous   Wound management:  Probed and deloculated   Drainage:  Purulent   Drainage amount:  Moderate   Packing materials:  1/4 in gauze Post-procedure details:    Procedure completion:  Tolerated well, no immediate complications (including critical care time)  Medications Ordered in UC Medications - No data to display  Initial Impression / Assessment and Plan / UC Course  I have reviewed the triage vital signs and the nursing notes.  Pertinent labs & imaging results that were available during my care of the patient were reviewed by me and considered in my medical decision making (see chart for details).     I&D completed on abscess.  See procedure note.  Packing inserted.  Advised patient to follow-up  in 2 days for reevaluation of abscess and to remove packing.  Advised patient on wound care.  Patient was advised to go to the hospital if abscess worsens in any way.  Patient to continue warm compresses and doxycycline antibiotic.  Wound culture completed.  May have inaccurate result for wound culture due to already initiating antibiotics but wound culture is still necessary at this time.  Patient to monitor fevers.Discussed strict return precautions. Patient verbalized understanding and is agreeable with plan.  Final Clinical Impressions(s) / UC Diagnoses   Final diagnoses:  Cutaneous abscess of back (any part, except buttock)     Discharge Instructions      Please follow-up at urgent care in 2 days for further evaluation of abscess and to remove packing.  If abscess worsens in any way, please go to the hospital.      ED Prescriptions   None    PDMP not reviewed this encounter.   Lance Muss, FNP 12/26/20 [phone]

## 2020-12-26 NOTE — Discharge Instructions (Addendum)
Please follow-up at urgent care in 2 days for further evaluation of abscess and to remove packing.  If abscess worsens in any way, please go to the hospital.

## 2020-12-28 ENCOUNTER — Other Ambulatory Visit: Payer: Self-pay

## 2020-12-28 ENCOUNTER — Ambulatory Visit
Admission: EM | Admit: 2020-12-28 | Discharge: 2020-12-28 | Disposition: A | Discharge status: Home / Self Care | Payer: No Typology Code available for payment source

## 2020-12-28 DIAGNOSIS — L02212 Cutaneous abscess of back [any part, except buttock]: Secondary | ICD-10-CM

## 2020-12-28 DIAGNOSIS — E119 Type 2 diabetes mellitus without complications: Secondary | ICD-10-CM

## 2020-12-28 DIAGNOSIS — Z5189 Encounter for other specified aftercare: Secondary | ICD-10-CM

## 2020-12-28 LAB — AEROBIC CULTURE W GRAM STAIN (SUPERFICIAL SPECIMEN)

## 2020-12-28 NOTE — Discharge Instructions (Addendum)
Please change your dressing 3-5 times daily. Do not apply any ointments or creams. Each time you change your dressing, make sure that you are pressing on the wound to get pus to come out.  Try your best to have a family member help you clean gently around the perimeter of the wound with gentle soap and warm water. Pat your wound dry and let it air out if possible to make sure it is dry before reapplying another dressing.  Please make sure you continue to take the antibiotic prescribed.  You no longer need to follow-up unless your symptoms worsen including fever, pain, worsening swelling.  I do recommend that you follow-up with your PCP.

## 2020-12-28 NOTE — ED Triage Notes (Addendum)
Pt here for packing removal from abscess that was packed on Sunday. Pt requesting work note for today.

## 2020-12-28 NOTE — ED Provider Notes (Signed)
[facility]   MRN: [id number] DOB: [birth year]  Subjective:   Debra Andrews is a 50 y.o. female presenting for wound check.  Patient was last seen in 12/26/2020.  She had I&D performed at the time, had packing placed.  She was to continue doxycycline.  Today, she reports she is doing a lot better.  Has changed the dressing once.  Has left the packing in place.  Denies fever, nausea, vomiting, chest pain.  No current facility-administered medications for this encounter.  Current Outpatient Medications:    acetaminophen (TYLENOL) 325 MG tablet, Take 2 tablets (650 mg total) by mouth every 6 (six) hours as needed for mild pain or moderate pain (or Fever >/= 101)., Disp: , Rfl:    Alcohol Swabs (ALCOHOL WIPES) 70 % PADS, Apply topically., Disp: , Rfl:    doxycycline (VIBRAMYCIN) 100 MG capsule, Take 1 capsule (100 mg total) by mouth 2 (two) times daily., Disp: 20 capsule, Rfl: 0   DULoxetine (CYMBALTA) 60 MG capsule, Take 60 mg by mouth daily., Disp: , Rfl:    flecainide (TAMBOCOR) 50 MG tablet, Take 50 mg by mouth 2 (two) times daily., Disp: , Rfl:    glucose blood (PRECISION QID TEST) test strip, Check 3 times daily, Disp: , Rfl:    glyBURIDE (DIABETA) 1.25 MG tablet, Take 1.25 mg by mouth 2 (two) times daily., Disp: , Rfl:    HYDROcodone-acetaminophen (NORCO/VICODIN) 5-325 MG tablet, Take 1-2 tablets by mouth every 4 (four) hours as needed for severe pain., Disp: 12 tablet, Rfl: 0   ibuprofen (ADVIL) 400 MG tablet, Take 1 tablet (400 mg total) by mouth every 8 (eight) hours as needed for mild pain or moderate pain., Disp: , Rfl:    insulin glargine (LANTUS) 100 UNIT/ML injection, Inject 12 Units into the skin., Disp: , Rfl:    Insulin Syringe-Needle U-100 (BD VEO INSULIN SYR U/F 1/2UNIT) 31G X 15/64" 0.3 ML MISC, Inject into the skin., Disp: , Rfl:    lisinopril (ZESTRIL) 10 MG tablet, Take 10 mg by mouth daily., Disp: , Rfl:    meloxicam (MOBIC) 15 MG tablet, Take 15 mg by mouth  daily., Disp: , Rfl:    metFORMIN (GLUCOPHAGE-XR) 750 MG 24 hr tablet, Take 750 mg by mouth 2 (two) times daily., Disp: , Rfl:    metoprolol tartrate (LOPRESSOR) 25 MG tablet, Take 25 mg by mouth 2 (two) times daily., Disp: , Rfl:    oxyCODONE-acetaminophen (PERCOCET/ROXICET) 5-325 MG tablet, Take 1 tablet by mouth every 6 (six) hours as needed for severe pain., Disp: 10 tablet, Rfl: 0   Allergies  Allergen Reactions   Penicillins Rash    Localized rash that did not require hospitalization - per patient     Past Medical History:  Diagnosis Date   Arrhythmia    Diabetes mellitus without complication (HCC)    Hyperlipidemia    Hypertension      Past Surgical History:  Procedure Laterality Date   LAPAROSCOPIC ENDOMETRIOSIS FULGURATION      No family history on file.  Social History   Tobacco Use   Smoking status: Former    Types: Cigarettes   Smokeless tobacco: Never  Substance Use Topics   Alcohol use: Yes    Comment: occasionally   Drug use: Never    ROS   Objective:   Vitals: BP 109/79 (BP Location: Left Arm)   Pulse (!) 118   Temp 98 F (36.7 C) (Oral)   Resp 18   SpO2 99%  Physical Exam Constitutional:      General: She is not in acute distress.    Appearance: Normal appearance. She is well-developed. She is not ill-appearing, toxic-appearing or diaphoretic.  HENT:     Head: Normocephalic and atraumatic.     Nose: Nose normal.     Mouth/Throat:     Mouth: Mucous membranes are moist.     Pharynx: Oropharynx is clear.  Eyes:     General: No scleral icterus.       Right eye: No discharge.        Left eye: No discharge.     Extraocular Movements: Extraocular movements intact.     Conjunctiva/sclera: Conjunctivae normal.     Pupils: Pupils are equal, round, and reactive to light.  Cardiovascular:     Rate and Rhythm: Normal rate.  Pulmonary:     Effort: Pulmonary effort is normal.  Skin:    General: Skin is warm and dry.  Neurological:      General: No focal deficit present.     Mental Status: She is alert and oriented to person, place, and time.  Psychiatric:        Mood and Affect: Mood normal.        Behavior: Behavior normal.        Thought Content: Thought content normal.        Judgment: Judgment normal.   Dressing removed, granulated tissue present.  Packing removed in its entirety.  Wound cleansed, pressure dressing applied.  Assessment and Plan :   PDMP not reviewed this encounter.  1. Encounter for wound care   2. Cutaneous abscess of back (any part, except buttock)   3. Type 2 diabetes mellitus treated without insulin (HCC)     Wound is healing well, maintain doxycycline.  Tylenol for pain.  No need for follow-up unless symptoms worsen again.  Follow-up with PCP. Counseled patient on potential for adverse effects with medications prescribed/recommended today, ER and return-to-clinic precautions discussed, patient verbalized understanding.    Wallis Bamberg, [place] 12/28/20 [phone]

## 2020-12-29 NOTE — Progress Notes (Signed)
Attempted to reach patient. No answer. Patient will need to continue doxycyline.

## 2020-12-29 NOTE — Progress Notes (Signed)
Patient notified in person in urgent are today.

## 2020-12-29 NOTE — Progress Notes (Signed)
Patient sent home on doxycycline. Wound healing well per provider note for wound check yesterday. Continue doxycycline.

## 2021-06-21 ENCOUNTER — Encounter: Payer: Self-pay | Admitting: Emergency Medicine

## 2021-06-21 ENCOUNTER — Other Ambulatory Visit: Payer: Self-pay

## 2021-06-21 ENCOUNTER — Ambulatory Visit
Admission: EM | Admit: 2021-06-21 | Discharge: 2021-06-21 | Disposition: A | Discharge status: Home / Self Care | Payer: No Typology Code available for payment source | Attending: Internal Medicine | Admitting: Internal Medicine

## 2021-06-21 DIAGNOSIS — J101 Influenza due to other identified influenza virus with other respiratory manifestations: Secondary | ICD-10-CM

## 2021-06-21 LAB — POCT INFLUENZA A/B
Influenza A, POC: POSITIVE — AB
Influenza B, POC: NEGATIVE

## 2021-06-21 MED ORDER — OSELTAMIVIR PHOSPHATE 75 MG PO CAPS: 75.0000 mg | ORAL_CAPSULE | Freq: Two times a day (BID) | ORAL | 0 refills | Status: AC

## 2021-06-21 NOTE — ED Provider Notes (Signed)
[name]    CSN: [id number] Arrival date & time: 06/21/21  1148      History   Chief Complaint Chief Complaint  Patient presents with   Cough   Generalized Body Aches    HPI Debra Andrews is a 51 y.o. female.   Patient presents with cough, body aches, fever, nasal congestion that has been present for approximately 2 days.  Tmax at home was 101.  She reports that she been exposed to influenza A from a family member recently.  Denies chest pain, shortness of breath, sore throat, ear pain, nausea, vomiting, diarrhea, abdominal pain.  Patient has taken over-the-counter cold and flu medication with minimal improvement in symptoms.   Cough  Past Medical History:  Diagnosis Date   Arrhythmia    Diabetes mellitus without complication (HCC)    Hyperlipidemia    Hypertension     Patient Active Problem List   Diagnosis Date Noted   Essential hypertension 03/29/2020   Preseptal cellulitis of right eye 03/29/2020   Sepsis (HCC) 03/29/2020   Paroxysmal supraventricular tachycardia (HCC) 12/26/2019   Type 2 diabetes mellitus without complication, with long-term current use of insulin (HCC) 08/15/2019   Anxiety disorder 08/15/2019    Past Surgical History:  Procedure Laterality Date   LAPAROSCOPIC ENDOMETRIOSIS FULGURATION      OB History   No obstetric history on file.      Home Medications    Prior to Admission medications   Medication Sig Start Date End Date Taking? Authorizing Provider  oseltamivir (TAMIFLU) 75 MG capsule Take 1 capsule (75 mg total) by mouth every 12 (twelve) hours. 06/21/21  Yes Nickayla Mcinnis, Acie Fredrickson, FNP  acetaminophen (TYLENOL) 325 MG tablet Take 2 tablets (650 mg total) by mouth every 6 (six) hours as needed for mild pain or moderate pain (or Fever >/= 101). 03/31/20   Tresa Moore, MD  Alcohol Swabs (ALCOHOL WIPES) 70 % PADS Apply topically. 07/31/19   [provider]  doxycycline (VIBRAMYCIN) 100 MG capsule Take 1 capsule  (100 mg total) by mouth 2 (two) times daily. 12/24/20   Gustavus Bryant, FNP  DULoxetine (CYMBALTA) 60 MG capsule Take 60 mg by mouth daily. 02/21/20   [provider]  flecainide (TAMBOCOR) 50 MG tablet Take 50 mg by mouth 2 (two) times daily. 11/21/19   [provider]  glucose blood (PRECISION QID TEST) test strip Check 3 times daily 07/31/19   [provider]  glyBURIDE (DIABETA) 1.25 MG tablet Take 1.25 mg by mouth 2 (two) times daily. 02/21/20   [provider]  ibuprofen (ADVIL) 400 MG tablet Take 1 tablet (400 mg total) by mouth every 8 (eight) hours as needed for mild pain or moderate pain. 03/31/20   Sreenath, Sudheer B, MD  insulin glargine (LANTUS) 100 UNIT/ML injection Inject 12 Units into the skin. 07/31/19   [provider]  Insulin Syringe-Needle U-100 (BD VEO INSULIN SYR U/F 1/2UNIT) 31G X 15/64" 0.3 ML MISC Inject into the skin. 07/31/19   [provider]  lisinopril (ZESTRIL) 10 MG tablet Take 10 mg by mouth daily. 08/12/17   [provider]  meloxicam (MOBIC) 15 MG tablet Take 15 mg by mouth daily. 08/12/17   [provider]  metFORMIN (GLUCOPHAGE-XR) 750 MG 24 hr tablet Take 750 mg by mouth 2 (two) times daily. 08/12/17   [provider]  metoprolol tartrate (LOPRESSOR) 25 MG tablet Take 25 mg by mouth 2 (two) times daily. 02/21/20  [provider]    Family History History reviewed. No pertinent family history.  Social History Social History   Tobacco Use   Smoking status: Former    Types: Cigarettes   Smokeless tobacco: Never  Substance Use Topics   Alcohol use: Yes    Comment: occasionally   Drug use: Never     Allergies   Penicillins   Review of Systems Review of Systems Per HPI  Physical Exam Triage Vital Signs ED Triage Vitals  Enc Vitals Group     BP 06/21/21 1326 115/79     Pulse Rate 06/21/21 1326 (!) 124     Resp 06/21/21 1326 18     Temp 06/21/21 1326 99.7 F  (37.6 C)     Temp Source 06/21/21 1326 Oral     SpO2 06/21/21 1326 97 %     Weight --      Height --      Head Circumference --      Peak Flow --      Pain Score 06/21/21 1327 6     Pain Loc --      Pain Edu? --      Excl. in GC? --    No data found.  Updated Vital Signs BP 115/79 (BP Location: Left Arm)    Pulse (!) 124    Temp 99.7 F (37.6 C) (Oral)    Resp 18    SpO2 97%   Visual Acuity Right Eye Distance:   Left Eye Distance:   Bilateral Distance:    Right Eye Near:   Left Eye Near:    Bilateral Near:     Physical Exam Constitutional:      General: She is not in acute distress.    Appearance: Normal appearance. She is not toxic-appearing or diaphoretic.  HENT:     Head: Normocephalic and atraumatic.     Right Ear: Tympanic membrane and ear canal normal.     Left Ear: Tympanic membrane and ear canal normal.     Nose: Congestion present.     Mouth/Throat:     Mouth: Mucous membranes are moist.     Pharynx: Posterior oropharyngeal erythema present.  Eyes:     Extraocular Movements: Extraocular movements intact.     Conjunctiva/sclera: Conjunctivae normal.     Pupils: Pupils are equal, round, and reactive to light.  Cardiovascular:     Rate and Rhythm: Normal rate and regular rhythm.     Pulses: Normal pulses.     Heart sounds: Normal heart sounds.  Pulmonary:     Effort: Pulmonary effort is normal. No respiratory distress.     Breath sounds: Normal breath sounds. No stridor. No wheezing, rhonchi or rales.  Abdominal:     General: Abdomen is flat. Bowel sounds are normal.     Palpations: Abdomen is soft.  Musculoskeletal:        General: Normal range of motion.     Cervical back: Normal range of motion.  Skin:    General: Skin is warm and dry.  Neurological:     General: No focal deficit present.     Mental Status: She is alert and oriented to person, place, and time. Mental status is at baseline.  Psychiatric:        Mood and Affect: Mood normal.         Behavior: Behavior normal.     UC Treatments / Results  Labs (all labs ordered are listed, but only abnormal results are  displayed) Labs Reviewed  POCT INFLUENZA A/B - Abnormal; Notable for the following components:      Result Value   Influenza A, POC Positive (*)    All other components within normal limits    EKG   Radiology No results found.  Procedures Procedures (including critical care time)  Medications Ordered in UC Medications - No data to display  Initial Impression / Assessment and Plan / UC Course  I have reviewed the triage vital signs and the nursing notes.  Pertinent labs & imaging results that were available during my care of the patient were reviewed by me and considered in my medical decision making (see chart for details).     Rapid flu test was positive for influenza A.  Will treat with Tamiflu.  Discussed supportive care and symptom management with patient.  Discussed return precautions.  Patient verbalized understanding and was agreeable with plan. Final Clinical Impressions(s) / UC Diagnoses   Final diagnoses:  Influenza A     Discharge Instructions      You have flu A which is being treated with Tamiflu.  You may take over-the-counter cold and flu medications in addition to this medication as well if needed.  Please follow-up if symptoms persist or worsen.     ED Prescriptions     Medication Sig Dispense Auth. Provider   oseltamivir (TAMIFLU) 75 MG capsule Take 1 capsule (75 mg total) by mouth every 12 (twelve) hours. 10 capsule Gustavus Bryant, [place]      PDMP not reviewed this encounter.   Gustavus Bryant, [place] 06/21/21 [phone]

## 2021-06-21 NOTE — ED Triage Notes (Signed)
Pt sts cough and body aches x 3 days

## 2021-06-21 NOTE — Discharge Instructions (Signed)
You have flu A which is being treated with Tamiflu.  You may take over-the-counter cold and flu medications in addition to this medication as well if needed.  Please follow-up if symptoms persist or worsen.

## 2021-06-23 ENCOUNTER — Emergency Department (HOSPITAL_COMMUNITY)
Admission: EM | Admit: 2021-06-23 | Discharge: 2021-06-23 | Disposition: A | Discharge status: Home / Self Care | Payer: PRIVATE HEALTH INSURANCE | Attending: Emergency Medicine | Admitting: Emergency Medicine

## 2021-06-23 ENCOUNTER — Ambulatory Visit
Admission: EM | Admit: 2021-06-23 | Discharge: 2021-06-23 | Disposition: A | Discharge status: Home / Self Care | Payer: No Typology Code available for payment source | Attending: Internal Medicine | Admitting: Internal Medicine

## 2021-06-23 ENCOUNTER — Encounter (HOSPITAL_COMMUNITY): Payer: Self-pay | Admitting: *Deleted

## 2021-06-23 ENCOUNTER — Other Ambulatory Visit: Payer: Self-pay

## 2021-06-23 ENCOUNTER — Emergency Department (HOSPITAL_COMMUNITY): Payer: PRIVATE HEALTH INSURANCE

## 2021-06-23 ENCOUNTER — Encounter: Payer: Self-pay | Admitting: Emergency Medicine

## 2021-06-23 DIAGNOSIS — R0789 Other chest pain: Secondary | ICD-10-CM | POA: Diagnosis present

## 2021-06-23 DIAGNOSIS — M791 Myalgia, unspecified site: Secondary | ICD-10-CM | POA: Insufficient documentation

## 2021-06-23 DIAGNOSIS — E119 Type 2 diabetes mellitus without complications: Secondary | ICD-10-CM | POA: Diagnosis not present

## 2021-06-23 DIAGNOSIS — J111 Influenza due to unidentified influenza virus with other respiratory manifestations: Secondary | ICD-10-CM | POA: Diagnosis not present

## 2021-06-23 DIAGNOSIS — I10 Essential (primary) hypertension: Secondary | ICD-10-CM | POA: Diagnosis not present

## 2021-06-23 DIAGNOSIS — R0602 Shortness of breath: Secondary | ICD-10-CM | POA: Insufficient documentation

## 2021-06-23 DIAGNOSIS — M549 Dorsalgia, unspecified: Secondary | ICD-10-CM | POA: Insufficient documentation

## 2021-06-23 DIAGNOSIS — Z87891 Personal history of nicotine dependence: Secondary | ICD-10-CM | POA: Insufficient documentation

## 2021-06-23 DIAGNOSIS — R059 Cough, unspecified: Secondary | ICD-10-CM | POA: Insufficient documentation

## 2021-06-23 DIAGNOSIS — R079 Chest pain, unspecified: Secondary | ICD-10-CM | POA: Diagnosis not present

## 2021-06-23 DIAGNOSIS — R6883 Chills (without fever): Secondary | ICD-10-CM | POA: Insufficient documentation

## 2021-06-23 LAB — CBC
HCT: 46.7 % — ABNORMAL HIGH (ref 36.0–46.0)
Hemoglobin: 15.8 g/dL — ABNORMAL HIGH (ref 12.0–15.0)
MCH: 30 pg (ref 26.0–34.0)
MCHC: 33.8 g/dL (ref 30.0–36.0)
MCV: 88.8 fL (ref 80.0–100.0)
Platelets: 195 10*3/uL (ref 150–400)
RBC: 5.26 MIL/uL — ABNORMAL HIGH (ref 3.87–5.11)
RDW: 11.7 % (ref 11.5–15.5)
WBC: 3.4 10*3/uL — ABNORMAL LOW (ref 4.0–10.5)
nRBC: 0 % (ref 0.0–0.2)

## 2021-06-23 LAB — BASIC METABOLIC PANEL
Anion gap: 16 — ABNORMAL HIGH (ref 5–15)
Anion gap: 10 (ref 5–15)
BUN: 11 mg/dL (ref 6–20)
BUN: 10 mg/dL (ref 6–20)
CO2: 12 mmol/L — ABNORMAL LOW (ref 22–32)
CO2: 19 mmol/L — ABNORMAL LOW (ref 22–32)
Calcium: 8.9 mg/dL (ref 8.9–10.3)
Calcium: 8.8 mg/dL — ABNORMAL LOW (ref 8.9–10.3)
Chloride: 106 mmol/L (ref 98–111)
Chloride: 102 mmol/L (ref 98–111)
Creatinine, Ser: 0.68 mg/dL (ref 0.44–1.00)
Creatinine, Ser: 0.78 mg/dL (ref 0.44–1.00)
GFR, Estimated: >60 mL/min (ref >60)
GFR, Estimated: >60 mL/min (ref >60)
Glucose, Bld: 298 mg/dL — ABNORMAL HIGH (ref 70–99)
Glucose, Bld: 522 mg/dL (ref 70–99)
Potassium: 4.6 mmol/L (ref 3.5–5.1)
Potassium: 3.8 mmol/L (ref 3.5–5.1)
Sodium: 134 mmol/L — ABNORMAL LOW (ref 135–145)
Sodium: 131 mmol/L — ABNORMAL LOW (ref 135–145)

## 2021-06-23 LAB — I-STAT VENOUS BLOOD GAS, ED
Acid-base deficit: 4 mmol/L — ABNORMAL HIGH (ref 0.0–2.0)
Bicarbonate: 19.7 mmol/L — ABNORMAL LOW (ref 20.0–28.0)
Calcium, Ion: 1.2 mmol/L (ref 1.15–1.40)
HCT: 42 % (ref 36.0–46.0)
Hemoglobin: 14.3 g/dL (ref 12.0–15.0)
O2 Saturation: 91 %
Potassium: 3.9 mmol/L (ref 3.5–5.1)
Sodium: 134 mmol/L — ABNORMAL LOW (ref 135–145)
TCO2: 21 mmol/L — ABNORMAL LOW (ref 22–32)
pCO2, Ven: 31.6 mmHg — ABNORMAL LOW (ref 44.0–60.0)
pH, Ven: 7.403 (ref 7.250–7.430)
pO2, Ven: 59 mmHg — ABNORMAL HIGH (ref 32.0–45.0)

## 2021-06-23 LAB — TROPONIN I (HIGH SENSITIVITY)
Troponin I (High Sensitivity): 3 ng/L (ref <18)
Troponin I (High Sensitivity): <2 ng/L (ref <18)

## 2021-06-23 LAB — CBG MONITORING, ED: Glucose-Capillary: 383 mg/dL — ABNORMAL HIGH (ref 70–99)

## 2021-06-23 LAB — BETA-HYDROXYBUTYRIC ACID: Beta-Hydroxybutyric Acid: 1.85 mmol/L — ABNORMAL HIGH (ref 0.05–0.27)

## 2021-06-23 LAB — MAGNESIUM: Magnesium: 1.6 mg/dL — ABNORMAL LOW (ref 1.7–2.4)

## 2021-06-23 MED ORDER — MAGNESIUM OXIDE -MG SUPPLEMENT 400 (240 MG) MG PO TABS
400.0000 mg | ORAL_TABLET | Freq: Once | ORAL | Status: AC
  Administered 2021-06-23: 400 mg via ORAL
  Filled 2021-06-23: qty 1

## 2021-06-23 MED ORDER — INSULIN ASPART 100 UNIT/ML IJ SOLN: 12.0000 [IU] | Freq: Once | INTRAMUSCULAR | Status: DC

## 2021-06-23 MED ORDER — ACETAMINOPHEN 325 MG PO TABS
650.0000 mg | ORAL_TABLET | Freq: Once | ORAL | Status: AC
  Administered 2021-06-23: 650 mg via ORAL
  Filled 2021-06-23: qty 2

## 2021-06-23 MED ORDER — SODIUM CHLORIDE 0.9 % IV BOLUS
1000.0000 mL | Freq: Once | INTRAVENOUS | Status: AC
  Administered 2021-06-23: 1000 mL via INTRAVENOUS

## 2021-06-23 MED ORDER — INSULIN GLARGINE 100 UNIT/ML ~~LOC~~ SOLN: 12.0000 [IU] | Freq: Every day | SUBCUTANEOUS | 1 refills | Status: AC

## 2021-06-23 MED ORDER — METFORMIN HCL ER 750 MG PO TB24: 750.0000 mg | ORAL_TABLET | Freq: Two times a day (BID) | ORAL | 0 refills | Status: AC

## 2021-06-23 MED ORDER — SODIUM CHLORIDE 0.9 % IV BOLUS: 1000.0000 mL | Freq: Once | INTRAVENOUS | Status: DC

## 2021-06-23 NOTE — ED Provider Triage Note (Signed)
Emergency Medicine Provider Triage Evaluation Note  Debra Andrews , a 51 y.o. female  was evaluated in triage.  Pt complains of chest pain and shortness of breath, midsternal, radiates to back, described as sore in nature, worse with coughing, improves with movement, pain is intermittent. Diagnosed with flu 2 days ago.  States that her sister recently died from heart related troubles at the age of approximately 55. Patient does not have any cardiac history, no PE or DVT history.  Is a non-smoker.  Does have history of hyperlipidemia, hypertension, diabetes. Review of Systems  Positive: Chest pain, shortness of breath, cough Negative: Vomiting, abdominal pain, fever  Physical Exam  There were no vitals taken for this visit. Gen:   Awake, no distress   Resp:  Normal effort  MSK:   Moves extremities without difficulty  Other:  No chest wall or back pain with palpation.  Medical Decision Making  Medically screening exam initiated at 12:49 PM.  Appropriate orders placed.  [name] was informed that the remainder of the evaluation will be completed by another provider, this initial triage assessment does not replace that evaluation, and the importance of remaining in the ED until their evaluation is complete.     Jeannie Fend, PA-C 06/23/21 1252

## 2021-06-23 NOTE — ED Notes (Signed)
Pt verbalizes understanding of d/c instructions. Prescriptions reviewed with patient. Pt ambulatory at d/c with all belongings and with family.   

## 2021-06-23 NOTE — ED Provider Notes (Signed)
[name]    CSN: [id number] Arrival date & time: 06/23/21  1050      History   Chief Complaint Chief Complaint  Patient presents with   Chest Pain   Back Pain    HPI Debra Andrews is a 51 y.o. female.   Patient here today for evaluation of chest pain and back pain that started after she was diagnosed with the flu 2 days ago. She reports she has had some shortness of breath as well as other flu like symptoms. She is concerned because her sister passed away from heart attack last week. Her sister was 4-5 years older than patient.   The history is provided by the patient.  Chest Pain Associated symptoms: back pain, cough, fever and shortness of breath   Back Pain Associated symptoms: chest pain and fever    Past Medical History:  Diagnosis Date   Arrhythmia    Diabetes mellitus without complication (HCC)    Hyperlipidemia    Hypertension     Patient Active Problem List   Diagnosis Date Noted   Essential hypertension 03/29/2020   Preseptal cellulitis of right eye 03/29/2020   Sepsis (HCC) 03/29/2020   Paroxysmal supraventricular tachycardia (HCC) 12/26/2019   Type 2 diabetes mellitus without complication, with long-term current use of insulin (HCC) 08/15/2019   Anxiety disorder 08/15/2019    Past Surgical History:  Procedure Laterality Date   LAPAROSCOPIC ENDOMETRIOSIS FULGURATION      OB History   No obstetric history on file.      Home Medications    Prior to Admission medications   Medication Sig Start Date End Date Taking? Authorizing Provider  acetaminophen (TYLENOL) 325 MG tablet Take 2 tablets (650 mg total) by mouth every 6 (six) hours as needed for mild pain or moderate pain (or Fever >/= 101). 03/31/20   Tresa Moore, MD  Alcohol Swabs (ALCOHOL WIPES) 70 % PADS Apply topically. 07/31/19   [provider]  doxycycline (VIBRAMYCIN) 100 MG capsule Take 1 capsule (100 mg total) by mouth 2 (two) times daily. 12/24/20    Gustavus Bryant, FNP  DULoxetine (CYMBALTA) 60 MG capsule Take 60 mg by mouth daily. 02/21/20   [provider]  flecainide (TAMBOCOR) 50 MG tablet Take 50 mg by mouth 2 (two) times daily. 11/21/19   [provider]  glucose blood (PRECISION QID TEST) test strip Check 3 times daily 07/31/19   [provider]  glyBURIDE (DIABETA) 1.25 MG tablet Take 1.25 mg by mouth 2 (two) times daily. 02/21/20   [provider]  ibuprofen (ADVIL) 400 MG tablet Take 1 tablet (400 mg total) by mouth every 8 (eight) hours as needed for mild pain or moderate pain. 03/31/20   Sreenath, Sudheer B, MD  insulin glargine (LANTUS) 100 UNIT/ML injection Inject 12 Units into the skin. 07/31/19   [provider]  Insulin Syringe-Needle U-100 (BD VEO INSULIN SYR U/F 1/2UNIT) 31G X 15/64" 0.3 ML MISC Inject into the skin. 07/31/19   [provider]  lisinopril (ZESTRIL) 10 MG tablet Take 10 mg by mouth daily. 08/12/17   [provider]  meloxicam (MOBIC) 15 MG tablet Take 15 mg by mouth daily. 08/12/17   [provider]  metFORMIN (GLUCOPHAGE-XR) 750 MG 24 hr tablet Take 750 mg by mouth 2 (two) times daily. 08/12/17   [provider]  metoprolol tartrate (LOPRESSOR) 25 MG tablet Take 25 mg by mouth 2 (two) times daily. 02/21/20   [provider]  oseltamivir (TAMIFLU) 75 MG capsule Take 1 capsule (75 mg total) by mouth every 12 (twelve) hours. 06/21/21   Gustavus Bryant, FNP    Family History History reviewed. No pertinent family history.  Social History Social History   Tobacco Use   Smoking status: Former    Types: Cigarettes   Smokeless tobacco: Never  Substance Use Topics   Alcohol use: Yes    Comment: occasionally   Drug use: Never     Allergies   Penicillins   Review of Systems Review of Systems  Constitutional:  Positive for chills and fever.  HENT:  Positive for congestion and rhinorrhea.   Eyes:  Negative for discharge and  redness.  Respiratory:  Positive for cough and shortness of breath.   Cardiovascular:  Positive for chest pain.  Musculoskeletal:  Positive for back pain and myalgias.    Physical Exam Triage Vital Signs ED Triage Vitals  Enc Vitals Group     BP 06/23/21 1058 (!) 129/91     Pulse Rate 06/23/21 1058 (!) 108     Resp 06/23/21 1058 16     Temp 06/23/21 1058 (!) 97.4 F (36.3 C)     Temp Source 06/23/21 1058 Oral     SpO2 06/23/21 1058 98 %     Weight --      Height --      Head Circumference --      Peak Flow --      Pain Score 06/23/21 1100 8     Pain Loc --      Pain Edu? --      Excl. in GC? --    No data found.  Updated Vital Signs BP (!) 129/91 (BP Location: Left Arm)    Pulse (!) 108    Temp (!) 97.4 F (36.3 C) (Oral)    Resp 16    SpO2 98%       Physical Exam Vitals and nursing note reviewed.  Constitutional:      General: She is not in acute distress.    Appearance: Normal appearance. She is not ill-appearing.  HENT:     Head: Normocephalic and atraumatic.     Nose: Congestion present.  Eyes:     Conjunctiva/sclera: Conjunctivae normal.  Cardiovascular:     Rate and Rhythm: Normal rate and regular rhythm.     Heart sounds: Normal heart sounds. No murmur heard. Pulmonary:     Effort: Pulmonary effort is normal. No respiratory distress.     Breath sounds: Normal breath sounds. No wheezing, rhonchi or rales.  Skin:    General: Skin is warm and dry.  Neurological:     Mental Status: She is alert.  Psychiatric:        Mood and Affect: Mood normal.        Thought Content: Thought content normal.     UC Treatments / Results  Labs (all labs ordered are listed, but only abnormal results are displayed) Labs Reviewed - No data to display  EKG   Radiology No results found.  Procedures Procedures (including critical care time)  Medications Ordered in UC Medications - No data to display  Initial Impression / Assessment and Plan / UC Course  I have  reviewed the triage vital signs and the nursing notes.  Pertinent labs & imaging results that were available during my care of the patient were reviewed by me and considered in my medical decision making (see chart for details).  Discussed that most likely symptoms are related to influenza but given significant discomfort and family history, recommended further evaluation in the ED for troponins to definitively rule out cardiac etiology. Patient's husband will drive her to local ED after leaving our office.  Final Clinical Impressions(s) / UC Diagnoses   Final diagnoses:  Influenza  Chest pain, unspecified type     Discharge Instructions       Please report to ED for further evaluation.       ED Prescriptions   None    PDMP not reviewed this encounter.   Tomi Bamberger, PA-C 06/23/21 1141

## 2021-06-23 NOTE — ED Triage Notes (Signed)
Pt reports being diagnosed with flu on Tuesday. Now has chest pain and into her back. Intermittent fever. Pain increases with cough and breathing. No resp distress is noted.

## 2021-06-23 NOTE — Discharge Instructions (Signed)
  Please report to ED for further evaluation.  

## 2021-06-23 NOTE — ED Triage Notes (Addendum)
Patient diagnosed with the flu two days ago. Since then has had generalized body aches, back pain, chest pain, and shortness of breath. Patient's sister died last week from a large heart attack

## 2021-06-23 NOTE — Discharge Instructions (Addendum)
You are seen and evaluated in our emergency department for chest pain. Your cardiac enzymes were within normal limits.  No evidence of pneumonia, pneumothorax noted on your chest x-ray.  Please follow-up with primary care doctor for further management of your hyperglycemia.

## 2021-06-23 NOTE — ED Notes (Signed)
Patient is being discharged from the Urgent Care and sent to the Emergency Department via POV . Per Erma Pinto PA, patient is in need of higher level of care due to chest pain, SOB. Patient is aware and verbalizes understanding of plan of care.  Vitals:   06/23/21 1058  BP: (!) 129/91  Pulse: (!) 108  Resp: 16  Temp: (!) 97.4 F (36.3 C)  SpO2: 98%

## 2021-06-23 NOTE — ED Provider Notes (Signed)
[facility] Emergency Department Provider Note MRN:  [id number]  Arrival date & time: 06/23/21     Chief Complaint   Chest Pain and Back Pain   History of Present Illness   Debra Andrews is a 51 y.o. year-old female with a history of Insulin dependence type 2 DM, HLD, HTN presenting to the ED with chief complaint of Chest and back discomfort.  Patient reports that chest pain and back pain.  Patient states that she was diagnosed with the flu 2 days ago.  Since that time she has felt some shortness of breath when coughing, has had myalgias, chills but denies having overt fevers.  Patient states that she is concerned because her chest and back have been hurting and her sister passed away from a heart attack last week.  She states that her chest pain is worse with manipulation and palpation.  Pain is sharp.  Denies that she has pressure, radiation, and states that her chest pain was not associated nausea or diaphoresis at the onset.  Patient also admits that she has been noncompliant with her diabetes medication regimen.  Review of Systems  A thorough review of systems was obtained and all systems are negative except as noted in the HPI and PMH.   Patient's Health History    Past Medical History:  Diagnosis Date   Arrhythmia    Diabetes mellitus without complication (HCC)    Hyperlipidemia    Hypertension     Past Surgical History:  Procedure Laterality Date   LAPAROSCOPIC ENDOMETRIOSIS FULGURATION      History reviewed. No pertinent family history.  Social History   Socioeconomic History   Marital status: Married    Spouse name: Not on file   Number of children: Not on file   Years of education: Not on file   Highest education level: Not on file  Occupational History   Not on file  Tobacco Use   Smoking status: Former    Types: Cigarettes   Smokeless tobacco: Never  Substance and Sexual Activity   Alcohol use: Yes    Comment: occasionally    Drug use: Never   Sexual activity: Not on file  Other Topics Concern   Not on file  Social History Narrative   Not on file   Social Determinants of Health   Financial Resource Strain: Not on file  Food Insecurity: Not on file  Transportation Needs: Not on file  Physical Activity: Not on file  Stress: Not on file  Social Connections: Not on file  Intimate Partner Violence: Not on file     Physical Exam   Physical Exam Constitutional:      Appearance: She is well-developed. She is not ill-appearing.     Comments: Generalized tenderness upon palpation of the large muscle groups is performed.  HENT:     Head: Normocephalic and atraumatic.     Right Ear: External ear normal.     Left Ear: External ear normal.     Nose: Nose normal.  Cardiovascular:     Rate and Rhythm: Normal rate and regular rhythm.     Pulses: Normal pulses.     Heart sounds: Normal heart sounds.  Pulmonary:     Effort: Pulmonary effort is normal. No respiratory distress.     Breath sounds: Normal breath sounds. No stridor. No wheezing.  Chest:     Chest wall: Tenderness present.  Abdominal:     General: Abdomen is flat.     Palpations:  Abdomen is soft.  Musculoskeletal:        General: Tenderness (Generalized) present.     Cervical back: Neck supple. No tenderness.  Skin:    General: Skin is warm and dry.  Neurological:     General: No focal deficit present.     Mental Status: She is alert and oriented to person, place, and time.      Diagnostic and Interventional Summary    Labs Reviewed  BASIC METABOLIC PANEL - Abnormal; Notable for the following components:      Result Value   Sodium 134 (*)    CO2 12 (*)    Glucose, Bld 298 (*)    Anion gap 16 (*)    All other components within normal limits  CBC - Abnormal; Notable for the following components:   WBC 3.4 (*)    RBC 5.26 (*)    Hemoglobin 15.8 (*)    HCT 46.7 (*)    All other components within normal limits  BASIC METABOLIC PANEL  - Abnormal; Notable for the following components:   Sodium 131 (*)    CO2 19 (*)    Glucose, Bld 522 (*)    Calcium 8.8 (*)    All other components within normal limits  MAGNESIUM - Abnormal; Notable for the following components:   Magnesium 1.6 (*)    All other components within normal limits  BETA-HYDROXYBUTYRIC ACID - Abnormal; Notable for the following components:   Beta-Hydroxybutyric Acid 1.85 (*)    All other components within normal limits  I-STAT VENOUS BLOOD GAS, ED - Abnormal; Notable for the following components:   pCO2, Ven 31.6 (*)    pO2, Ven 59.0 (*)    Bicarbonate 19.7 (*)    TCO2 21 (*)    Acid-base deficit 4.0 (*)    Sodium 134 (*)    All other components within normal limits  CBG MONITORING, ED - Abnormal; Notable for the following components:   Glucose-Capillary 383 (*)    All other components within normal limits  TROPONIN I (HIGH SENSITIVITY)  TROPONIN I (HIGH SENSITIVITY)    DG Chest 2 View  Final Result      Medications  sodium chloride 0.9 % bolus 1,000 mL (0 mLs Intravenous Stopped 06/23/21 2325)  acetaminophen (TYLENOL) tablet 650 mg (650 mg Oral Given 06/23/21 2201)  magnesium oxide (MAG-OX) tablet 400 mg (400 mg Oral Given 06/23/21 2328)     Procedures  /  Critical Care Procedures  ED Course and Medical Decision Making  Initial Impression and Ddx This is a 50 year old female presents to emergency department for evaluation of chest and back discomfort.  Patient recently diagnosed with influenza.  Differential includes but is not limited to the following: ACS, pneumonia, pneumothorax, costochondritis.  I suspect that the patient's symptoms are likely related to recent influenza diagnosis and the patient is experiencing myalgias.  Patient without leg swelling, hemoptysis.  I suspect that the patient's tachycardia which improved in the emergency department is likely secondary to dehydration.  Patient states that she has had very minimal p.o. intake  over the past few days secondary to her feeling unwell after being diagnosed with influenza.  Labs and imaging were obtained in triage.  Past medical/surgical history that increases complexity of ED encounter: Diabetes, hypertension, hyperlipidemia  Interpretation of Diagnostics I personally reviewed the EKG, Chest Xray, and Cardiac Monitor and my interpretation is as follows: EKG is normal sinus rhythm without acute ST elevations.  Chest x-ray without acute cardiopulmonary  findings.  Remains in normal sinus rhythm cardiac monitor.    Labs obtained in triage revealed negative troponins x2.  CBC was also unremarkable.  However the patient's metabolic panel revealed hyperglycemia with elevated anion gap.  These labs were obtained 7 hours prior to me evaluating the patient.  At the time of my evaluation of the patient I administered 1 L of normal saline and repeated BMP as well as obtained VBG, beta hydroxybutyrate, and mag.  Mag was low so I replaced the patient orally with mag.  Patient was not acidotic on VBG.  And repeat BMP did not reveal elevation in anion gap however the patient remains hyperglycemic  Repeat poc glucose was down trending.   Patient Reassessment and Ultimate Disposition/Management On my reassessment the patient is resting comfortably.  Given that she is having downtrending and her blood glucose I recommended to the patient that she continues taking her diabetic medication regimen.  I provided the patient with prescriptions for her insulin and metformin.  Return precautions were provided to the patient.  Patient management required discussion with the following services or consulting groups:  None  Complexity of Problems Addressed Acute illness or injury that poses threat of life of bodily function  Additional Data Reviewed and Analyzed Further history obtained from: Recent PCP notes  Factors Impacting ED Encounter Risk Consideration of hospitalization    Final  Clinical Impressions(s) / ED Diagnoses     ICD-10-CM   1. Chest wall pain  R07.89       ED Discharge Orders          Ordered    metFORMIN (GLUCOPHAGE-XR) 750 MG 24 hr tablet  2 times daily        06/23/21 2331    insulin glargine (LANTUS) 100 UNIT/ML injection  Daily        06/23/21 2331             Discharge Instructions Discussed with and Provided to Patient:     Discharge Instructions      You are seen and evaluated in our emergency department for chest pain. Your cardiac enzymes were within normal limits.  No evidence of pneumonia, pneumothorax noted on your chest x-ray.  Please follow-up with primary care doctor for further management of your hyperglycemia.        Camila Li, MD 06/23/21 2130    Gerhard Munch, MD 06/23/21 [phone]

## 2022-07-22 ENCOUNTER — Ambulatory Visit
Admission: EM | Admit: 2022-07-22 | Discharge: 2022-07-22 | Disposition: A | Discharge status: Home / Self Care | Payer: No Typology Code available for payment source

## 2022-07-22 DIAGNOSIS — S60562A Insect bite (nonvenomous) of left hand, initial encounter: Secondary | ICD-10-CM | POA: Diagnosis not present

## 2022-07-22 DIAGNOSIS — W57XXXA Bitten or stung by nonvenomous insect and other nonvenomous arthropods, initial encounter: Secondary | ICD-10-CM | POA: Diagnosis not present

## 2022-07-22 NOTE — ED Triage Notes (Signed)
Patient presents to UC for insect bite x yesterday. Used alcohol to clean it. States increased pain to site.   Denies fever or drainage.

## 2022-07-22 NOTE — ED Provider Notes (Signed)
[facility]    CSN: [id number] Arrival date & time: 07/22/22  1106      History   Chief Complaint Chief Complaint  Patient presents with   Insect Bite    HPI Debra Andrews is a 52 y.o. female.   Patient presents with possible insect bite to left knuckle of the second digit.  She reports that she noticed it yesterday.  Patient denies feeling or witnessing anything bite her.  She reports that she does have some pain and swelling associated with it.  Denies any fever or drainage from the area.  Patient's heart rate is mildly elevated.  She reports that she has a history of tachycardia which is baseline for her.  She has not taken her medication for her heart rate yet today.  Patient not reporting chest pain, shortness of breath, headache, dizziness.     Past Medical History:  Diagnosis Date   Arrhythmia    Diabetes mellitus without complication ([place])    Hyperlipidemia    Hypertension     Patient Active Problem List   Diagnosis Date Noted   Essential hypertension 03/29/2020   Preseptal cellulitis of right eye 03/29/2020   Sepsis ([place]) 03/29/2020   Paroxysmal supraventricular tachycardia 12/26/2019   Type 2 diabetes mellitus without complication, with long-term current use of insulin ([place]) 08/15/2019   Anxiety disorder 08/15/2019    Past Surgical History:  Procedure Laterality Date   LAPAROSCOPIC ENDOMETRIOSIS FULGURATION      OB History   No obstetric history on file.      Home Medications    Prior to Admission medications   Medication Sig Start Date End Date Taking? Authorizing Provider  acetaminophen (TYLENOL) 325 MG tablet Take 2 tablets (650 mg total) by mouth every 6 (six) hours as needed for mild pain or moderate pain (or Fever >/= 101). 03/31/20   Sidney Ace, MD  Alcohol Swabs (ALCOHOL WIPES) 70 % PADS Apply topically. 07/31/19   [provider]  doxycycline (VIBRAMYCIN) 100 MG capsule Take 1 capsule (100 mg total) by mouth 2  (two) times daily. 12/24/20   Teodora Medici, FNP  DULoxetine (CYMBALTA) 60 MG capsule Take 60 mg by mouth daily. 02/21/20   [provider]  flecainide (TAMBOCOR) 50 MG tablet Take 50 mg by mouth 2 (two) times daily. 11/21/19   [provider]  glucose blood (PRECISION QID TEST) test strip Check 3 times daily 07/31/19   [provider]  glyBURIDE (DIABETA) 1.25 MG tablet Take 1.25 mg by mouth 2 (two) times daily. 02/21/20   [provider]  ibuprofen (ADVIL) 400 MG tablet Take 1 tablet (400 mg total) by mouth every 8 (eight) hours as needed for mild pain or moderate pain. 03/31/20   Ralene Muskrat B, MD  insulin glargine (LANTUS) 100 UNIT/ML injection Inject 0.12 mLs (12 Units total) into the skin daily. 06/23/21   Zachery Dakins, MD  Insulin Syringe-Needle U-100 (BD VEO INSULIN SYR U/F 1/2UNIT) 31G X 15/64" 0.3 ML MISC Inject into the skin. 07/31/19   [provider]  lisinopril (ZESTRIL) 10 MG tablet Take 10 mg by mouth daily. 08/12/17   [provider]  meloxicam (MOBIC) 15 MG tablet Take 15 mg by mouth daily. 08/12/17   [provider]  metFORMIN (GLUCOPHAGE-XR) 750 MG 24 hr tablet Take 1 tablet (750 mg total) by mouth 2 (two) times daily. 06/23/21   Zachery Dakins, MD  metoprolol tartrate (LOPRESSOR) 25 MG tablet Take 25  mg by mouth 2 (two) times daily. 02/21/20   [provider]  oseltamivir (TAMIFLU) 75 MG capsule Take 1 capsule (75 mg total) by mouth every 12 (twelve) hours. 06/21/21   Teodora Medici, FNP    Family History History reviewed. No pertinent family history.  Social History Social History   Tobacco Use   Smoking status: Former    Types: Cigarettes   Smokeless tobacco: Never  Substance Use Topics   Alcohol use: Yes    Comment: occasionally   Drug use: Never     Allergies   Penicillins   Review of Systems Review of Systems Per HPI  Physical Exam Triage Vital Signs ED Triage Vitals  Enc  Vitals Group     BP 07/22/22 1118 131/74     Pulse Rate 07/22/22 1118 (!) 111     Resp 07/22/22 1118 15     Temp 07/22/22 1118 98.4 F (36.9 C)     Temp Source 07/22/22 1118 Oral     SpO2 07/22/22 1118 98 %     Weight --      Height --      Head Circumference --      Peak Flow --      Pain Score 07/22/22 1117 6     Pain Loc --      Pain Edu? --      Excl. in [place]? --    No data found.  Updated Vital Signs BP 131/74 (BP Location: Left Arm)   Pulse (!) 111   Temp 98.4 F (36.9 C) (Oral)   Resp 15   SpO2 98%   Visual Acuity Right Eye Distance:   Left Eye Distance:   Bilateral Distance:    Right Eye Near:   Left Eye Near:    Bilateral Near:     Physical Exam Constitutional:      General: She is not in acute distress.    Appearance: Normal appearance. She is not toxic-appearing or diaphoretic.  HENT:     Head: Normocephalic and atraumatic.  Eyes:     Extraocular Movements: Extraocular movements intact.     Conjunctiva/sclera: Conjunctivae normal.  Cardiovascular:     Rate and Rhythm: Regular rhythm. Tachycardia present.     Pulses: Normal pulses.     Heart sounds: Normal heart sounds.  Pulmonary:     Effort: Pulmonary effort is normal. No respiratory distress.     Breath sounds: Normal breath sounds.  Skin:    Comments: Patient has 2 flat pinpoint areas of erythema present to second MCP joint on the dorsal surface of the left hand.  Mild swelling surrounding with no discoloration.  Patient does have tenderness to palpation overlying area.  No purulent drainage noted.  Patient has full range of motion of finger.  Capillary refill and pulses intact.  Neurological:     General: No focal deficit present.     Mental Status: She is alert and oriented to person, place, and time. Mental status is at baseline.  Psychiatric:        Mood and Affect: Mood normal.        Behavior: Behavior normal.        Thought Content: Thought content normal.        Judgment: Judgment  normal.      UC Treatments / Results  Labs (all labs ordered are listed, but only abnormal results are displayed) Labs Reviewed - No data to display  EKG   Radiology No results  found.  Procedures Procedures (including critical care time)  Medications Ordered in UC Medications - No data to display  Initial Impression / Assessment and Plan / UC Course  I have reviewed the triage vital signs and the nursing notes.  Pertinent labs & imaging results that were available during my care of the patient were reviewed by me and considered in my medical decision making (see chart for details).     Patient appears to have insect versus spider bite to knuckle of left hand.  There ire no signs of cellulitis or bacterial infection noted.  Patient has some inflammation surrounding which is consistent with bite.  Patient advised to monitor very closely for signs of infection or worsening symptoms and follow-up as soon as they occur.  Patient also has tachycardia but after further review of the chart, this heart rate appears baseline for patient.  She also reports that she has not taken her medication for her heart rate today.  Given this is baseline for patient and she is asymptomatic, do not think that any emergent evaluation or further workup is necessary for this.  Encouraged patient to monitor heart rate at home with pulse oximeter.  Discussed return and ER precautions.  Patient verbalized understanding and was agreeable with plan. Final Clinical Impressions(s) / UC Diagnoses   Final diagnoses:  Insect bite of left hand, initial encounter     Discharge Instructions      Please monitor site very closely for any increased redness, swelling, pus and follow-up with primary care doctor or urgent care if this occurs.  Monitor heart rate closely as well.    ED Prescriptions   None    PDMP not reviewed this encounter.   Teodora Medici, [place] 07/22/22 1145

## 2022-07-22 NOTE — Discharge Instructions (Signed)
Please monitor site very closely for any increased redness, swelling, pus and follow-up with primary care doctor or urgent care if this occurs.  Monitor heart rate closely as well.

## 2023-01-21 ENCOUNTER — Ambulatory Visit
Admission: EM | Admit: 2023-01-21 | Discharge: 2023-01-21 | Disposition: A | Discharge status: Home / Self Care | Payer: No Typology Code available for payment source | Attending: Internal Medicine | Admitting: Internal Medicine

## 2023-01-21 ENCOUNTER — Ambulatory Visit: Payer: No Typology Code available for payment source

## 2023-01-21 DIAGNOSIS — M25572 Pain in left ankle and joints of left foot: Secondary | ICD-10-CM | POA: Diagnosis not present

## 2023-01-21 DIAGNOSIS — W19XXXA Unspecified fall, initial encounter: Secondary | ICD-10-CM | POA: Diagnosis not present

## 2023-01-21 DIAGNOSIS — M79672 Pain in left foot: Secondary | ICD-10-CM

## 2023-01-21 NOTE — ED Triage Notes (Addendum)
Patient states she fell outside her sisters car yesterday and twisted left ankle and foot. Pt treated with ice, Tylenol and propped her foot up.

## 2023-01-21 NOTE — ED Provider Notes (Signed)
[name]    CSN: [id number] Arrival date & time: 01/21/23  1224      History   Chief Complaint No chief complaint on file.   HPI Debra Andrews is a 52 y.o. female.   Patient presents with left ankle and foot pain that started yesterday after injury.  Patient reports that she was stepping out of a car when she lost her shoe causing her to lose balance and fall twisting her left ankle and foot over.  Denies hitting head or losing consciousness.  She does not take any blood thinning medications.  Has taken Tylenol for pain.     Past Medical History:  Diagnosis Date   Arrhythmia    Diabetes mellitus without complication (HCC)    Hyperlipidemia    Hypertension     Patient Active Problem List   Diagnosis Date Noted   Essential hypertension 03/29/2020   Preseptal cellulitis of right eye 03/29/2020   Sepsis (HCC) 03/29/2020   Paroxysmal supraventricular tachycardia 12/26/2019   Type 2 diabetes mellitus without complication, with long-term current use of insulin (HCC) 08/15/2019   Anxiety disorder 08/15/2019    Past Surgical History:  Procedure Laterality Date   LAPAROSCOPIC ENDOMETRIOSIS FULGURATION      OB History   No obstetric history on file.      Home Medications    Prior to Admission medications   Medication Sig Start Date End Date Taking? Authorizing Provider  acetaminophen (TYLENOL) 325 MG tablet Take 2 tablets (650 mg total) by mouth every 6 (six) hours as needed for mild pain or moderate pain (or Fever >/= 101). 03/31/20   Tresa Moore, MD  Alcohol Swabs (ALCOHOL WIPES) 70 % PADS Apply topically. 07/31/19   [provider]  doxycycline (VIBRAMYCIN) 100 MG capsule Take 1 capsule (100 mg total) by mouth 2 (two) times daily. 12/24/20   Gustavus Bryant, FNP  DULoxetine (CYMBALTA) 60 MG capsule Take 60 mg by mouth daily. 02/21/20   [provider]  flecainide (TAMBOCOR) 50 MG tablet Take 50 mg by mouth 2 (two) times daily.  11/21/19   [provider]  glucose blood (PRECISION QID TEST) test strip Check 3 times daily 07/31/19   [provider]  glyBURIDE (DIABETA) 1.25 MG tablet Take 1.25 mg by mouth 2 (two) times daily. 02/21/20   [provider]  ibuprofen (ADVIL) 400 MG tablet Take 1 tablet (400 mg total) by mouth every 8 (eight) hours as needed for mild pain or moderate pain. 03/31/20   Lolita Patella B, MD  insulin glargine (LANTUS) 100 UNIT/ML injection Inject 0.12 mLs (12 Units total) into the skin daily. 06/23/21   Camila Li, MD  Insulin Syringe-Needle U-100 (BD VEO INSULIN SYR U/F 1/2UNIT) 31G X 15/64" 0.3 ML MISC Inject into the skin. 07/31/19   [provider]  lisinopril (ZESTRIL) 10 MG tablet Take 10 mg by mouth daily. 08/12/17   [provider]  meloxicam (MOBIC) 15 MG tablet Take 15 mg by mouth daily. 08/12/17   [provider]  metFORMIN (GLUCOPHAGE-XR) 750 MG 24 hr tablet Take 1 tablet (750 mg total) by mouth 2 (two) times daily. 06/23/21   Camila Li, MD  metoprolol tartrate (LOPRESSOR) 25 MG tablet Take 25 mg by mouth 2 (two) times daily. 02/21/20   [provider]  oseltamivir (TAMIFLU) 75 MG capsule Take 1 capsule (75 mg total) by mouth every 12 (twelve) hours. 06/21/21   Gustavus Bryant, FNP  Family History No family history on file.  Social History Social History   Tobacco Use   Smoking status: Former    Types: Cigarettes   Smokeless tobacco: Never  Substance Use Topics   Alcohol use: Yes    Comment: occasionally   Drug use: Never     Allergies   Penicillins   Review of Systems Review of Systems Per HPI  Physical Exam Triage Vital Signs ED Triage Vitals  Encounter Vitals Group     BP 01/21/23 1252 (!) 136/93     Systolic BP Percentile --      Diastolic BP Percentile --      Pulse Rate 01/21/23 1252 (!) 114     Resp --      Temp 01/21/23 1252 99.3 F (37.4 C)     Temp Source 01/21/23 1252 Oral      SpO2 01/21/23 1252 97 %     Weight 01/21/23 1254 107 lb 12.8 oz (48.9 kg)     Height 01/21/23 1254 4\' 11"  (1.499 m)     Head Circumference --      Peak Flow --      Pain Score 01/21/23 1254 7     Pain Loc --      Pain Education --      Exclude from Growth Chart --    No data found.  Updated Vital Signs BP (!) 136/93 (BP Location: Left Arm)   Pulse (!) 114   Temp 99.3 F (37.4 C) (Oral)   Ht 4\' 11"  (1.499 m)   Wt 107 lb 12.8 oz (48.9 kg)   SpO2 97%   BMI 21.77 kg/m   Visual Acuity Right Eye Distance:   Left Eye Distance:   Bilateral Distance:    Right Eye Near:   Left Eye Near:    Bilateral Near:     Physical Exam Constitutional:      General: She is not in acute distress.    Appearance: Normal appearance. She is not toxic-appearing or diaphoretic.  HENT:     Head: Normocephalic and atraumatic.  Eyes:     Extraocular Movements: Extraocular movements intact.     Conjunctiva/sclera: Conjunctivae normal.  Pulmonary:     Effort: Pulmonary effort is normal.  Musculoskeletal:     Comments: Tenderness to palpation to lateral malleolus of left ankle that extends to the dorsal surface of the foot.  Mild swelling to the lateral malleolus present as well.  No abrasions or lacerations.  No discoloration noted.  Patient can wiggle toes.  Appears neurovascularly intact.  Neurological:     General: No focal deficit present.     Mental Status: She is alert and oriented to person, place, and time. Mental status is at baseline.  Psychiatric:        Mood and Affect: Mood normal.        Behavior: Behavior normal.        Thought Content: Thought content normal.        Judgment: Judgment normal.      UC Treatments / Results  Labs (all labs ordered are listed, but only abnormal results are displayed) Labs Reviewed - No data to display  EKG   Radiology DG Ankle Complete Left  Result Date: 01/21/2023 CLINICAL DATA:  Status post fall. EXAM: LEFT ANKLE COMPLETE - 3+ VIEW  COMPARISON:  None Available. FINDINGS: There is no evidence of fracture, dislocation, or joint effusion. There is no evidence of arthropathy or other focal bone abnormality. Lateral  soft tissue swelling. IMPRESSION: 1. No acute fracture or dislocation identified about the left ankle. 2. Lateral soft tissue swelling. Electronically Signed   By: Ted Mcalpine M.D.   On: 01/21/2023 13:56   DG Foot Complete Left  Result Date: 01/21/2023 CLINICAL DATA:  Fall EXAM: LEFT FOOT - COMPLETE 3+ VIEW COMPARISON:  None Available. FINDINGS: There is no evidence of fracture or dislocation. There is no evidence of arthropathy or other focal bone abnormality. Plantar calcaneal spur. Soft tissues are unremarkable. IMPRESSION: Negative. Electronically Signed   By: Duanne Guess D.O.   On: 01/21/2023 13:37    Procedures Procedures (including critical care time)  Medications Ordered in UC Medications - No data to display  Initial Impression / Assessment and Plan / UC Course  I have reviewed the triage vital signs and the nursing notes.  Pertinent labs & imaging results that were available during my care of the patient were reviewed by me and considered in my medical decision making (see chart for details).     X-rays are negative for any acute bony abnormality.  Suspect ankle sprain.  Advised RICE.  Patient already has ankle brace.  Advised safe over-the-counter pain relievers and following up with orthopedist at provided phone number if symptoms persist or worsen.  Patient verbalized understanding and was agreeable with plan. Final Clinical Impressions(s) / UC Diagnoses   Final diagnoses:  Fall, initial encounter  Acute left ankle pain  Left foot pain     Discharge Instructions      X-rays were normal.  Suspect ankle sprain.  Follow-up with orthopedist if pain persists or worsens.  Elevate extremity and apply ice.     ED Prescriptions   None    PDMP not reviewed this encounter.   Gustavus Bryant, [place] 01/21/23 [phone]

## 2023-01-21 NOTE — Discharge Instructions (Signed)
X-rays were normal.  Suspect ankle sprain.  Follow-up with orthopedist if pain persists or worsens.  Elevate extremity and apply ice.

## 2023-02-03 IMAGING — DX DG RIBS W/ CHEST 3+V*L*
3 series · 3 of 3 positions shown · non-contrast
Comparison: 07/30/2019

CLINICAL DATA: Fall.  Left chest pain

EXAM:
LEFT RIBS AND CHEST - 3+ VIEW

[chest pa]
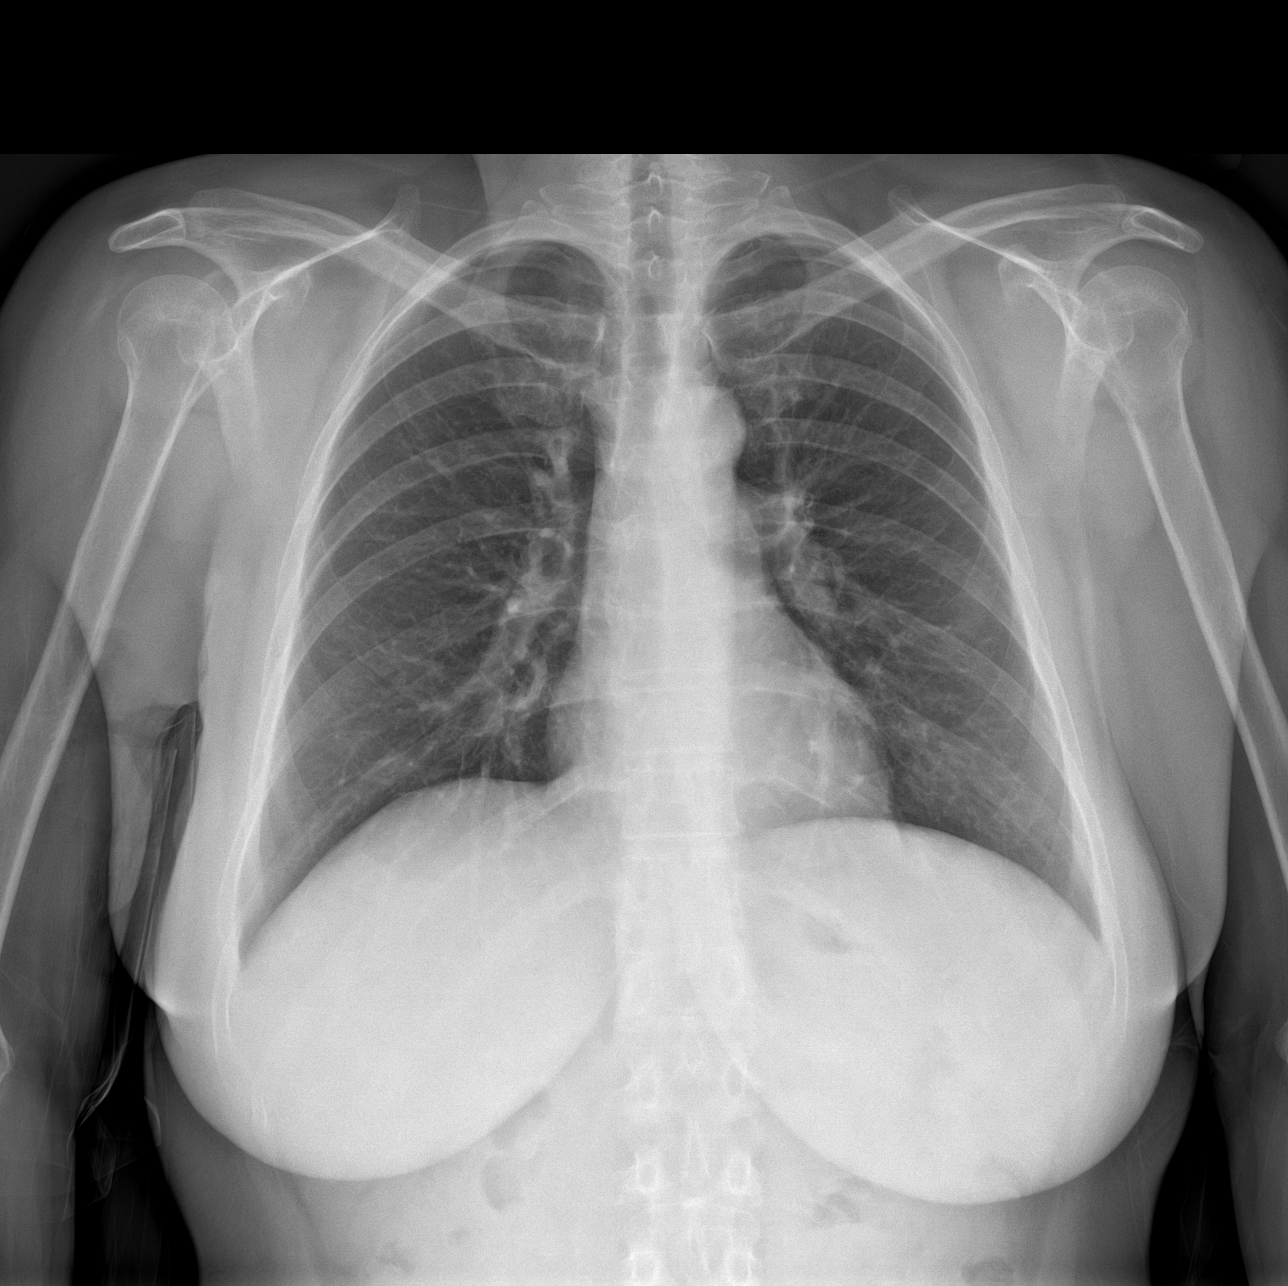

[rib ap]
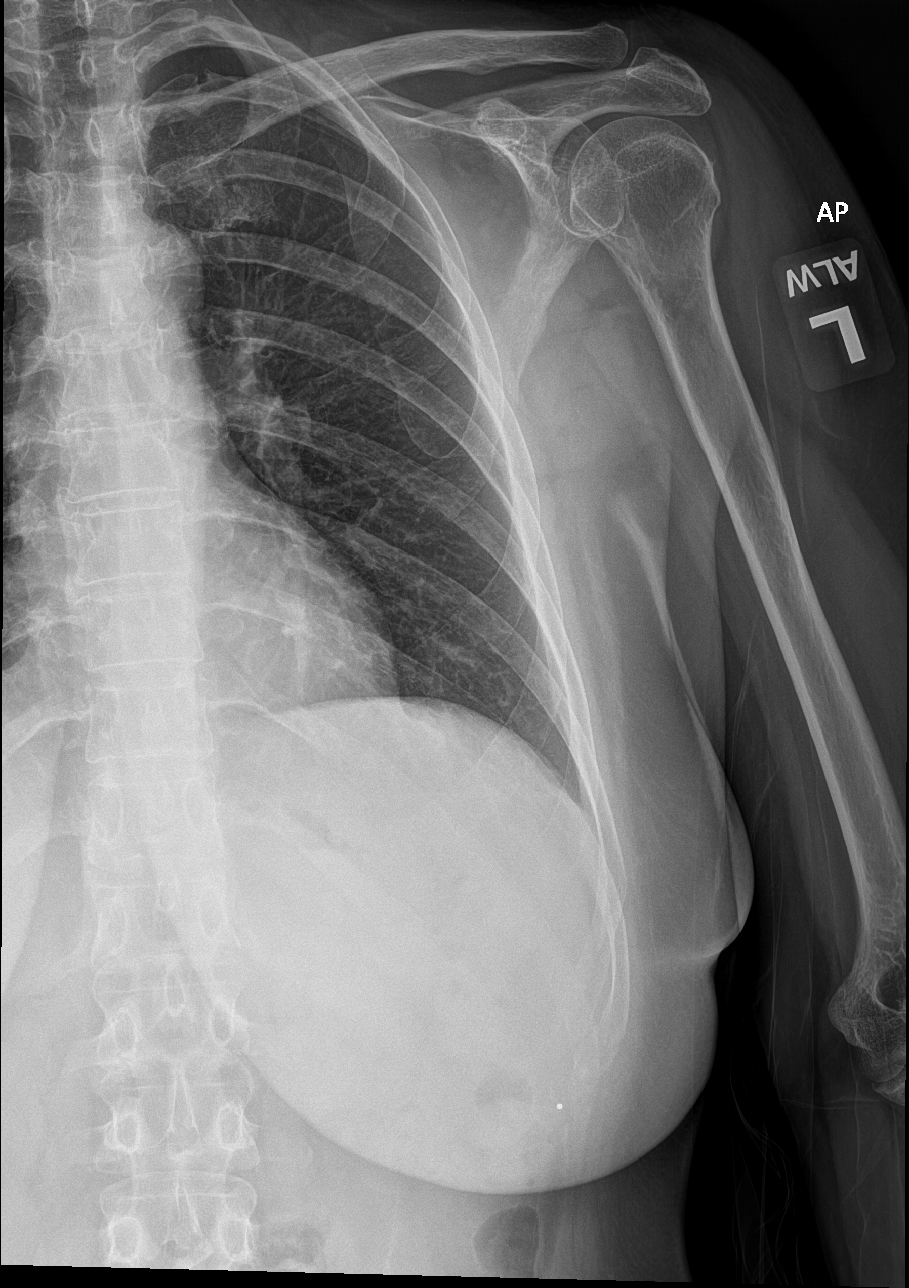

[rib obl]
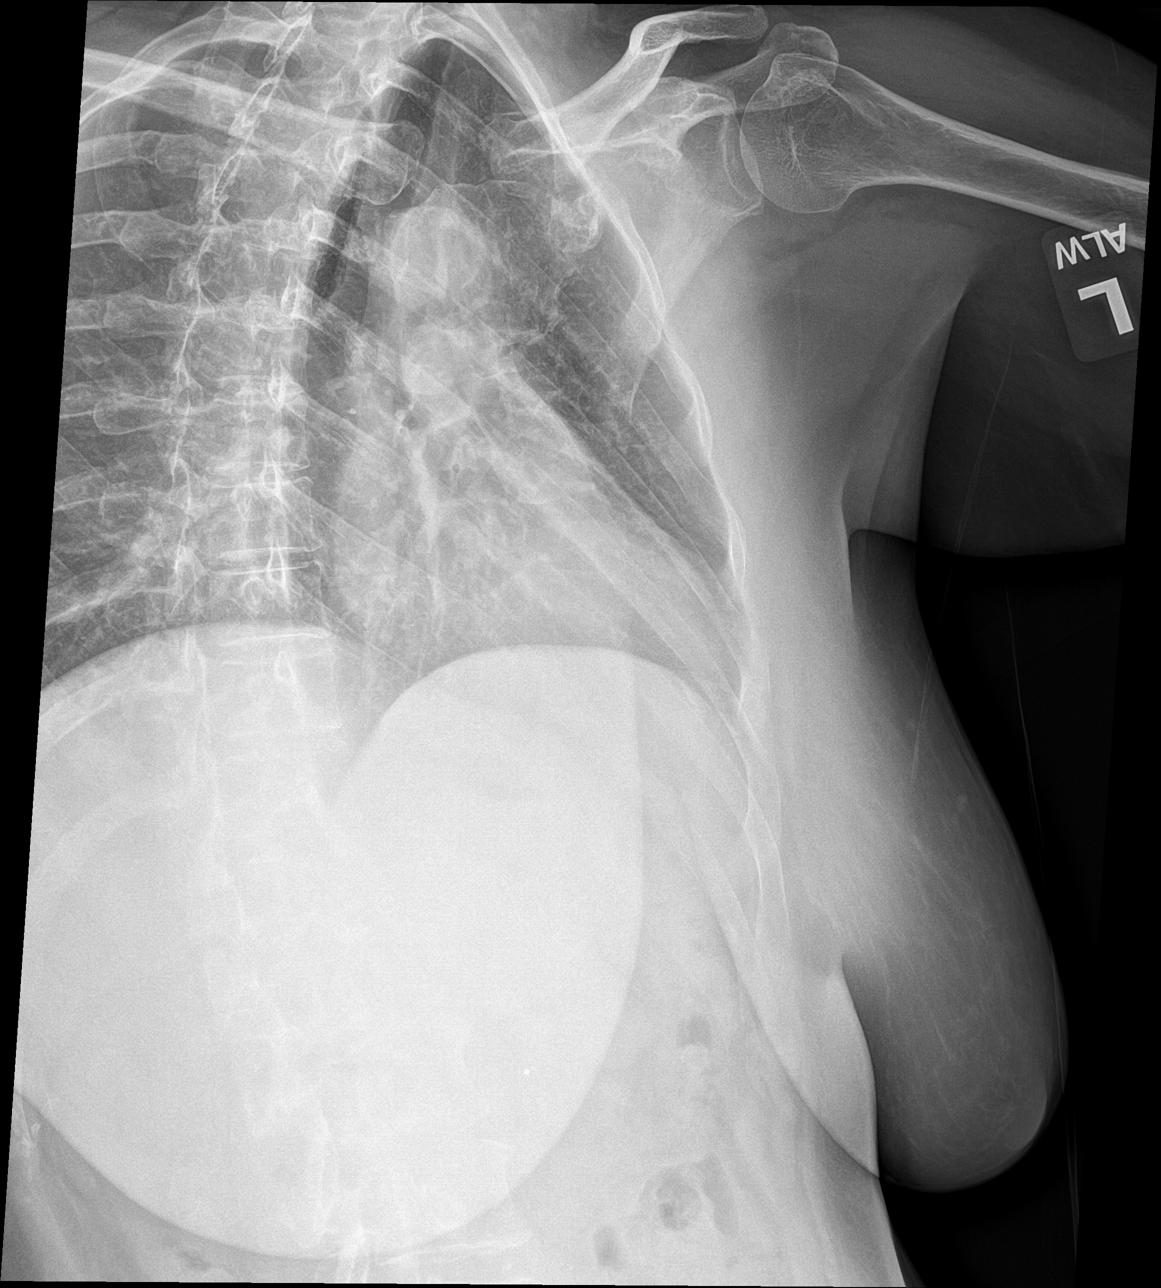

[3 of 3 positions shown; findings below may reference images not displayed]

FINDINGS: No fracture or other bone lesions are seen involving the ribs. There
is no evidence of pneumothorax or pleural effusion. Both lungs are
clear. Heart size and mediastinal contours are within normal limits.
IMPRESSION: Negative.

## 2023-03-31 ENCOUNTER — Ambulatory Visit
Admission: EM | Admit: 2023-03-31 | Discharge: 2023-03-31 | Disposition: A | Discharge status: Home / Self Care | Payer: No Typology Code available for payment source

## 2023-03-31 ENCOUNTER — Ambulatory Visit: Payer: No Typology Code available for payment source

## 2023-03-31 DIAGNOSIS — M25522 Pain in left elbow: Secondary | ICD-10-CM

## 2023-03-31 DIAGNOSIS — L03114 Cellulitis of left upper limb: Secondary | ICD-10-CM | POA: Diagnosis not present

## 2023-03-31 HISTORY — DX: Other specified bacterial intestinal infections: A04.8

## 2023-03-31 MED ORDER — DOXYCYCLINE HYCLATE 100 MG PO CAPS: 100.0000 mg | ORAL_CAPSULE | Freq: Two times a day (BID) | ORAL | 0 refills | Status: AC

## 2023-03-31 NOTE — ED Triage Notes (Signed)
X3 days  Pt states that she has some left arm pain. Pt denies any known injury.

## 2023-03-31 NOTE — Discharge Instructions (Signed)
I will call if x-ray is abnormal.  I have prescribed an additional antibiotic.  Please take with food to avoid stomach upset.  Follow-up with PCP.

## 2023-03-31 NOTE — ED Provider Notes (Signed)
[name]    CSN: [id number] Arrival date & time: 03/31/23  1140      History   Chief Complaint Chief Complaint  Patient presents with   Arm Pain    HPI Debra Andrews is a 52 y.o. female.   Patient presents with left elbow pain that started a few days ago.  Reports that she noticed an area of redness that popped up on the elbow.  She is now having difficulty straightening her elbow.  Denies any associated fever or injury.  Denies any insect or spider bite.   Arm Pain    Past Medical History:  Diagnosis Date   Arrhythmia    Diabetes mellitus without complication (HCC)    Helicobacter pylori infection    Hyperlipidemia    Hypertension     Patient Active Problem List   Diagnosis Date Noted   Essential hypertension 03/29/2020   Preseptal cellulitis of right eye 03/29/2020   Sepsis (HCC) 03/29/2020   Paroxysmal supraventricular tachycardia (HCC) 12/26/2019   Type 2 diabetes mellitus without complication, with long-term current use of insulin (HCC) 08/15/2019   Anxiety disorder 08/15/2019    Past Surgical History:  Procedure Laterality Date   LAPAROSCOPIC ENDOMETRIOSIS FULGURATION      OB History   No obstetric history on file.      Home Medications    Prior to Admission medications   Medication Sig Start Date End Date Taking? Authorizing Provider  acetaminophen (TYLENOL) 325 MG tablet Take 2 tablets (650 mg total) by mouth every 6 (six) hours as needed for mild pain or moderate pain (or Fever >/= 101). 03/31/20  Yes Sreenath, Sudheer B, MD  Alcohol Swabs (ALCOHOL WIPES) 70 % PADS Apply topically. 07/31/19  Yes [provider]  clarithromycin (BIAXIN) 500 MG tablet Take 500 mg by mouth 2 (two) times daily. 03/29/23  Yes [provider]  doxycycline (VIBRAMYCIN) 100 MG capsule Take 1 capsule (100 mg total) by mouth 2 (two) times daily. 03/31/23  Yes Mahlani Berninger, Rolly Salter E, FNP  DULoxetine (CYMBALTA) 60 MG capsule Take 60 mg by mouth  daily. 02/21/20  Yes [provider]  flecainide (TAMBOCOR) 50 MG tablet Take 50 mg by mouth 2 (two) times daily. 11/21/19  Yes [provider]  glucose blood (PRECISION QID TEST) test strip Check 3 times daily 07/31/19  Yes [provider]  glyBURIDE (DIABETA) 1.25 MG tablet Take 1.25 mg by mouth 2 (two) times daily. 02/21/20  Yes [provider]  ibuprofen (ADVIL) 400 MG tablet Take 1 tablet (400 mg total) by mouth every 8 (eight) hours as needed for mild pain or moderate pain. 03/31/20  Yes Sreenath, Sudheer B, MD  insulin glargine (LANTUS) 100 UNIT/ML injection Inject 0.12 mLs (12 Units total) into the skin daily. 06/23/21  Yes Camila Li, MD  Insulin Syringe-Needle U-100 (BD VEO INSULIN SYR U/F 1/2UNIT) 31G X 15/64" 0.3 ML MISC Inject into the skin. 07/31/19  Yes [provider]  lisinopril (ZESTRIL) 10 MG tablet Take 10 mg by mouth daily. 08/12/17  Yes [provider]  meloxicam (MOBIC) 15 MG tablet Take 15 mg by mouth daily. 08/12/17  Yes [provider]  metFORMIN (GLUCOPHAGE-XR) 750 MG 24 hr tablet Take 1 tablet (750 mg total) by mouth 2 (two) times daily. 06/23/21  Yes Camila Li, MD  metoprolol tartrate (LOPRESSOR) 25 MG tablet Take 25 mg by mouth 2 (two) times daily. 02/21/20  Yes [provider]  metroNIDAZOLE (FLAGYL) 500 MG  tablet Take 500 mg by mouth 2 (two) times daily. 03/29/23  Yes [provider]  omeprazole (PRILOSEC) 40 MG capsule Take 40 mg by mouth daily. 03/12/23  Yes [provider]  oseltamivir (TAMIFLU) 75 MG capsule Take 1 capsule (75 mg total) by mouth every 12 (twelve) hours. 06/21/21  Yes Gustavus Bryant, FNP    Family History History reviewed. No pertinent family history.  Social History Social History   Tobacco Use   Smoking status: Former    Types: Cigarettes   Smokeless tobacco: Never  Substance Use Topics   Alcohol use: Not Currently    Comment: occasionally    Drug use: Never     Allergies   Penicillins   Review of Systems Review of Systems Per HPI  Physical Exam Triage Vital Signs ED Triage Vitals  Encounter Vitals Group     BP 03/31/23 1335 123/88     Systolic BP Percentile --      Diastolic BP Percentile --      Pulse Rate 03/31/23 1335 (!) 111     Resp 03/31/23 1335 16     Temp 03/31/23 1335 98.6 F (37 C)     Temp Source 03/31/23 1335 Oral     SpO2 03/31/23 1335 98 %     Weight 03/31/23 1332 99 lb 4.8 oz (45 kg)     Height 03/31/23 1332 4\' 11"  (1.499 m)     Head Circumference --      Peak Flow --      Pain Score 03/31/23 1332 8     Pain Loc --      Pain Education --      Exclude from Growth Chart --    No data found.  Updated Vital Signs BP 123/88 (BP Location: Right Arm)   Pulse (!) 111   Temp 98.6 F (37 C) (Oral)   Resp 16   Ht 4\' 11"  (1.499 m)   Wt 99 lb 4.8 oz (45 kg)   SpO2 98%   BMI 20.06 kg/m   Visual Acuity Right Eye Distance:   Left Eye Distance:   Bilateral Distance:    Right Eye Near:   Left Eye Near:    Bilateral Near:     Physical Exam Constitutional:      General: She is not in acute distress.    Appearance: Normal appearance. She is not toxic-appearing or diaphoretic.  HENT:     Head: Normocephalic and atraumatic.  Eyes:     Extraocular Movements: Extraocular movements intact.     Conjunctiva/sclera: Conjunctivae normal.  Pulmonary:     Effort: Pulmonary effort is normal.  Musculoskeletal:     Comments: Patient is having difficulty extending elbow.  Mild area of redness with no significant swelling noted to posterior elbow.  No area of fluctuance or induration.  Capillary refill and pulses intact.  Grip strength 5/5.  Neurological:     General: No focal deficit present.     Mental Status: She is alert and oriented to person, place, and time. Mental status is at baseline.  Psychiatric:        Mood and Affect: Mood normal.        Behavior: Behavior normal.        Thought  Content: Thought content normal.        Judgment: Judgment normal.      UC Treatments / Results  Labs (all labs ordered are listed, but only abnormal results are displayed) Labs Reviewed  CBC  BASIC METABOLIC PANEL    EKG   Radiology DG Elbow Complete Left  Result Date: 03/31/2023 CLINICAL DATA:  Elbow pain. EXAM: LEFT ELBOW - COMPLETE 3+ VIEW COMPARISON:  None Available. FINDINGS: There is no evidence of fracture, dislocation, or joint effusion. There is no evidence of arthropathy or other focal bone abnormality. Soft tissues are unremarkable. IMPRESSION: Negative. Electronically Signed   By: Ted Mcalpine M.D.   On: 03/31/2023 15:19    Procedures Procedures (including critical care time)  Medications Ordered in UC Medications - No data to display  Initial Impression / Assessment and Plan / UC Course  I have reviewed the triage vital signs and the nursing notes.  Pertinent labs & imaging results that were available during my care of the patient were reviewed by me and considered in my medical decision making (see chart for details).     X-ray negative for any acute bony abnormality especially concern for osteomyelitis.  Patient has area of redness to elbow which is concerning for cellulitis versus spider bite that could be contributing to inflammation of elbow and causing elbow pain.  Will treat with doxycycline.  Patient is currently taking metronidazole and clarithromycin for H. pylori but due I do think that benefits outweigh risks with additional antibiotic therapy as metronidazole and clarithromycin do not cover skin organisms well.  Advised patient to take medication with food and risks associated with multiple antibiotic therapy.  Advised her to follow-up with PCP or urgent care if symptoms persist or worsen.  No I&D indicated at this time.  Advised strict ER precautions.  Patient verbalized understanding and was agreeable with plan. Final Clinical Impressions(s) /  UC Diagnoses   Final diagnoses:  Left elbow pain  Cellulitis of left elbow     Discharge Instructions      I will call if x-ray is abnormal.  I have prescribed an additional antibiotic.  Please take with food to avoid stomach upset.  Follow-up with PCP.     ED Prescriptions     Medication Sig Dispense Auth. Provider   doxycycline (VIBRAMYCIN) 100 MG capsule Take 1 capsule (100 mg total) by mouth 2 (two) times daily. 20 capsule Gustavus Bryant, [place]      PDMP not reviewed this encounter.   Gustavus Bryant, [place] 03/31/23 1524

## 2023-04-01 LAB — BASIC METABOLIC PANEL
BUN/Creatinine Ratio: 24 — ABNORMAL HIGH (ref 9–23)
BUN: 11 mg/dL (ref 6–24)
CO2: 18 mmol/L — ABNORMAL LOW (ref 20–29)
Calcium: 9.6 mg/dL (ref 8.7–10.2)
Chloride: 100 mmol/L (ref 96–106)
Creatinine, Ser: 0.46 mg/dL — ABNORMAL LOW (ref 0.57–1.00)
Glucose: 295 mg/dL — ABNORMAL HIGH (ref 70–99)
Potassium: 4.7 mmol/L (ref 3.5–5.2)
Sodium: 139 mmol/L (ref 134–144)
eGFR: 114 mL/min/{1.73_m2} (ref >59)

## 2023-04-01 LAB — CBC
Hematocrit: 47.1 % — ABNORMAL HIGH (ref 34.0–46.6)
Hemoglobin: 15.6 g/dL (ref 11.1–15.9)
MCH: 30.2 pg (ref 26.6–33.0)
MCHC: 33.1 g/dL (ref 31.5–35.7)
MCV: 91 fL (ref 79–97)
Platelets: 260 10*3/uL (ref 150–450)
RBC: 5.17 x10E6/uL (ref 3.77–5.28)
RDW: 11.7 % (ref 11.7–15.4)
WBC: 7.1 10*3/uL (ref 3.4–10.8)

## 2024-01-28 DIAGNOSIS — E113212 Type 2 diabetes mellitus with mild nonproliferative diabetic retinopathy with macular edema, left eye: Secondary | ICD-10-CM | POA: Diagnosis not present

## 2024-02-01 DIAGNOSIS — E113212 Type 2 diabetes mellitus with mild nonproliferative diabetic retinopathy with macular edema, left eye: Secondary | ICD-10-CM | POA: Diagnosis not present

## 2024-02-01 DIAGNOSIS — H5203 Hypermetropia, bilateral: Secondary | ICD-10-CM | POA: Diagnosis not present

## 2024-02-01 DIAGNOSIS — H52223 Regular astigmatism, bilateral: Secondary | ICD-10-CM | POA: Diagnosis not present

## 2024-02-25 DIAGNOSIS — R002 Palpitations: Secondary | ICD-10-CM | POA: Diagnosis not present

## 2024-02-25 DIAGNOSIS — R000 Tachycardia, unspecified: Secondary | ICD-10-CM | POA: Diagnosis not present

## 2024-02-25 DIAGNOSIS — R079 Chest pain, unspecified: Secondary | ICD-10-CM | POA: Diagnosis not present

## 2024-02-25 DIAGNOSIS — R29818 Other symptoms and signs involving the nervous system: Secondary | ICD-10-CM | POA: Diagnosis not present

## 2024-02-27 DIAGNOSIS — Z6830 Body mass index (BMI) 30.0-30.9, adult: Secondary | ICD-10-CM | POA: Diagnosis not present

## 2024-02-27 DIAGNOSIS — R000 Tachycardia, unspecified: Secondary | ICD-10-CM | POA: Diagnosis not present

## 2024-02-27 DIAGNOSIS — R079 Chest pain, unspecified: Secondary | ICD-10-CM | POA: Diagnosis not present

## 2024-02-27 DIAGNOSIS — E119 Type 2 diabetes mellitus without complications: Secondary | ICD-10-CM | POA: Diagnosis not present

## 2024-02-27 DIAGNOSIS — G629 Polyneuropathy, unspecified: Secondary | ICD-10-CM | POA: Diagnosis not present

## 2024-03-03 DIAGNOSIS — R002 Palpitations: Secondary | ICD-10-CM | POA: Diagnosis not present

## 2024-03-15 DIAGNOSIS — I493 Ventricular premature depolarization: Secondary | ICD-10-CM | POA: Diagnosis not present

## 2024-03-15 DIAGNOSIS — I491 Atrial premature depolarization: Secondary | ICD-10-CM | POA: Diagnosis not present

## 2024-03-15 DIAGNOSIS — R002 Palpitations: Secondary | ICD-10-CM | POA: Diagnosis not present

## 2024-03-15 DIAGNOSIS — I472 Ventricular tachycardia, unspecified: Secondary | ICD-10-CM | POA: Diagnosis not present

## 2024-04-01 DIAGNOSIS — R000 Tachycardia, unspecified: Secondary | ICD-10-CM | POA: Diagnosis not present

## 2024-04-16 DIAGNOSIS — E138 Other specified diabetes mellitus with unspecified complications: Secondary | ICD-10-CM | POA: Diagnosis not present

## 2024-04-16 DIAGNOSIS — Z794 Long term (current) use of insulin: Secondary | ICD-10-CM | POA: Diagnosis not present

## 2024-04-24 DIAGNOSIS — L02821 Furuncle of head [any part, except face]: Secondary | ICD-10-CM | POA: Diagnosis not present

## 2024-04-24 DIAGNOSIS — F418 Other specified anxiety disorders: Secondary | ICD-10-CM | POA: Diagnosis not present

## 2024-04-24 DIAGNOSIS — Z6830 Body mass index (BMI) 30.0-30.9, adult: Secondary | ICD-10-CM | POA: Diagnosis not present
